# Patient Record
Sex: Female | Born: 1985 | Race: White | Hispanic: No | Marital: Single
Health system: Southern US, Community
[De-identification: ages and names within clinical notes are randomized; demographics above are authoritative.]

## PROBLEM LIST (undated history)

## (undated) DIAGNOSIS — F419 Anxiety disorder, unspecified: Secondary | ICD-10-CM

---

## 2009-08-26 ENCOUNTER — Emergency Department: Admit: 2009-08-26 | Disposition: A | Discharge status: Home / Self Care | Payer: Self-pay | Source: Ambulatory Visit

## 2009-08-26 LAB — CBC AND DIFFERENTIAL
Baso # K/uL: 0 THOU/uL (ref 0.0–0.1)
Basophil %: 0.4 % (ref 0.1–1.2)
Eos # K/uL: 0 THOU/uL (ref 0.0–0.4)
Eosinophil %: 0.1 % — ABNORMAL LOW (ref 0.7–5.8)
Hematocrit: 41 % (ref 34–45)
Hemoglobin: 14.2 g/dL (ref 11.2–15.7)
Lymph # K/uL: 1.6 THOU/uL (ref 1.2–3.7)
Lymphocyte %: 18.3 % — ABNORMAL LOW (ref 19.3–51.7)
MCV: 91 fL (ref 79–95)
Mono # K/uL: 0.6 THOU/uL (ref 0.2–0.9)
Monocyte %: 7.1 % (ref 4.7–12.5)
Neut # K/uL: 6.3 THOU/uL — ABNORMAL HIGH (ref 1.6–6.1)
Platelets: 224 THOU/uL (ref 160–370)
RBC: 4.5 MIL/uL (ref 3.9–5.2)
RDW: 11.9 % (ref 11.7–14.4)
Seg Neut %: 74.1 % — ABNORMAL HIGH (ref 34.0–71.1)
WBC: 8.5 THOU/uL (ref 4.0–10.0)

## 2009-08-26 LAB — URINALYSIS REFLEX TO CULTURE
Blood,UA: NEGATIVE
Ketones, UA: NEGATIVE
Nitrite,UA: NEGATIVE
Protein,UA: NEGATIVE mg/dL

## 2009-08-26 LAB — URINALYSIS WITH REFLEX TO CULTURE
Specific Gravity,UA: 1.006 (ref 1.002–1.030)
pH,UA: 7 (ref 5.0–8.0)

## 2009-08-26 LAB — RUQ PANEL (ED ONLY)
ALT: 13 U/L (ref 0–35)
AST: 21 U/L (ref 0–35)
Albumin: 4.5 g/dL (ref 3.5–5.2)
Alk Phos: 60 U/L (ref 35–105)
Amylase: 55 U/L (ref 28–100)
Bilirubin,Direct: 0.2 mg/dL (ref 0.0–0.3)
Bilirubin,Total: 0.8 mg/dL (ref 0.0–1.2)
Lipase: 26 U/L (ref 13–60)
Total Protein: 7 g/dL (ref 6.3–7.7)

## 2009-08-26 LAB — URINE MICROSCOPIC (IQ200)
RBC,UA: NONE SEEN /HPF (ref 0–2)
WBC,UA: 1 /HPF (ref 0–5)

## 2009-08-26 LAB — PLASMA PROF 7 (ED ONLY)
Anion Gap,PL: 10 (ref 7–16)
CO2,Plasma: 23 mmol/L (ref 20–28)
Chloride,Plasma: 107 mmol/L (ref 96–108)
Creatinine: 0.61 mg/dL (ref 0.51–0.95)
GFR,Black: >59 *
GFR,Caucasian: >59 *
Glucose,Plasma: 81 mg/dL (ref 74–106)
Potassium,Plasma: 3.5 mmol/L (ref 3.4–4.7)
Sodium,Plasma: 140 mmol/L (ref 132–146)
UN,Plasma: 9 mg/dL (ref 6–20)

## 2009-08-26 LAB — POCT URINE PREGNANCY (ED ONLY): Preg,UR POCT: NEGATIVE m[IU]/mL

## 2009-08-26 NOTE — ED Provider Notes (Signed)
 VISIT NUMBER: [id number]    TIME SEEN IN THE EMERGENCY ROOM:  08/26/2008 at 1 p.m.    TIME OF DISCHARGE:  3:30 p.m.    CHIEF COMPLAINT: Abdominal pain.    HPI:  This patient is a 24 year old female with what appears to be chronic  abdominal pain, who presents to the emergency room with complaints of  abdominal pain worse on the right side. The patient did have a couple  episodes of nausea and vomiting this morning. She reports tactile fevers;  no chills. She denies any urinary symptoms or vaginal discharge. The  patient had a normal bowel movement this morning. The patient has been  drinking appropriately, although states she has had slightly decreased  solid intake. She reports being able to tolerate yogurt and a granola bar  for breakfast. The patient states that she was recently admitted to  [facility] for gastroenteritis, at which time she did undergo an ultrasound  and a CT scan of her abdomen and pelvis that was unremarkable. The patient  states that her pain does appear to be similar to the pain a couple weeks  ago.    ALLERGIES:  Tramadol, which causes dizziness and nausea.    MEDICATIONS:  Depo-Provera.    PAST MEDICAL / SURGICAL HISTORY:  Gastroenteritis and abdominal pain.    FAMILY HISTORY:  None.    SOCIAL HISTORY:  The patient smokes three cigarettes per day; has  occasional alcohol use; denies any street drugs; works at [employer]; lives at home with her roommate, and denies history of domestic  violence.    REVIEW OF SYSTEMS:  Pertinent per the HPI and PMH.    PHYSICAL EXAMINATION:  Temperature: 36.4.  Respiratory rate: 20.  Pulse: 72.  Blood pressure: 118/77.  Pulse oximetry: 98% on room air.  General Appearance: The patient does not appear to be in any acute  distress.  Eyes: Pupils are equal and reactive to light. Extraocular movements are  intact.  Head, Ears, Nose, and Throat: Hearing and voice are within normal limits.  Oropharynx, lips, and gums are moist.  Cardiovascular: S1 and S2.  Regular rate and rhythm.  Pulmonary: The lungs are clear to auscultation bilaterally.  Abdomen: The abdomen is soft, with minimal tenderness to the right lower  quadrant as well as right upper quadrant. No rebound or guarding. No psoas  sign. No obturator sign. Normoactive bowel sounds. No CVA tenderness. Skin:  Color and temperature are within normal limits.  Musculoskeletal: The patient appears to be moving all extremities without  difficulty.  Psychiatric: Judgment, orientation, and mood are all within normal limits.      MEDICAL DECISION MAKING/CONDITION:  This patient is a 24 year old female  who presents to the emergency room with complaints of abdominal pain; two  episodes of nausea, vomiting, and tactile fevers. The patient was recently  admitted to [facility] for gastroenteritis. The patient states that  her pain does feel similar to this, although she states that she did have a  normal bowel movement this morning, and denies any history of diarrhea.        DDX:  Acute-on-chronic abdominal pain; viral illness; appendicitis;  cholecystitis; hepatitis.        PLAN:  To start with a screening evaluation including an ED7, CBC,        right upper quadrant panel, and urinalysis. Her pain will be treated        with intramuscular injection of morphine and Phenergan for nausea.  Should her blood work come back abnormal, we will decide whether or        not we will proceed with any further imaging. However, I am hesitant        to do this, given that the patient has a recent CT scan as of 2 weeks        ago and states that her pain is very similar to the pain she was        experiencing back then.    DATA:        LABS:  The patient has had basic laboratory work including an ED7,        CBC, right upper quadrant panel, and urinalysis, all unremarkable.        Urine pregnancy test was negative.        RADIOLOGIC STUDIES:        ECG:        RHYTHM STRIPS:    PROCEDURE (Attestation):  CRITICAL CARE TIME  (Exclusive of other billable procedures):  CONSULT:  PCP NOTIFIED:  SMOKING CESSATION COUNSELING:    FOLLOW-UP NOTE AND DISPOSITION:  Patient reevaluated and states that her  pain has improved. She has not had any nausea, vomiting, or diarrhea during  her stay here in the emergency room. Her work-up has been essentially  negative, and she is requesting to eat, which I believe is a good sign. The  patient will be discharged to home; advised to follow up with her primary  care physician, who may decide that the patient needs a referral to  possibly GI, given that these symptoms do appear to be quite chronic. She  is to return to the emergency room for fevers, chills, worsening pain,  uncontrolled nausea, vomiting, or further concerns.    CONDITION: Satisfactory.    ED DIAGNOSIS:  Nonspecific abdominal pain.            Dictated by:  Elson Areas, NP  Electronically Signed and Finalized by  Elson Areas, NP 08/30/2009 18:36  ___________________________________________  Elson Areas, NP  DD:   08/26/2009  DT:   08/26/2009  7:39 P  NF/AO1#[id number]  [id number]    cc:

## 2014-09-28 ENCOUNTER — Other Ambulatory Visit: Payer: Self-pay | Admitting: Emergency Medicine

## 2014-09-28 LAB — COMPREHENSIVE METABOLIC PANEL
ALT: 21 U/L^U/L (ref 12–78)
AST: 20 U/L^U/L (ref 11–37)
Albumin: 3.7 g/dL^g/dL (ref 3.4–5.0)
Alk Phos: 108 U/L^U/L (ref 45–117)
Anion Gap: 7 (ref 3–11)
Bilirubin,Total: 0.6 mg/dL^mg/dL (ref 0.2–1.0)
CO2: 24 meq/L^meq/L (ref 21–32)
Calcium: 8.7 mg/dL^mg/dL (ref 8.5–10.1)
Chloride: 109 meq/L^meq/L — ABNORMAL HIGH (ref 98–107)
Creatinine: 0.6 mg/dL^mg/dL (ref 0.6–1.3)
GFR,Black: 144 mL/min/{1.73_m2}
GFR,Caucasian: 119 mL/min/{1.73_m2}
Glucose: 92 mg/dL^mg/dL (ref 70–100)
Lab: 8 mg/dL^mg/dL (ref 7–18)
Potassium: 4.1 meq/L^meq/L (ref 3.5–5.1)
Sodium: 140 meq/L^meq/L (ref 136–145)
Total Protein: 6.9 g/dL^g/dL (ref 6.4–8.2)

## 2014-09-28 LAB — URINALYSIS REFLEX TO CULTURE
Bilirubin,Ur: NEGATIVE
Blood,UA: NEGATIVE
Glucose,UA: NEGATIVE mg/dL^mg/dL
Ketones, UA: NEGATIVE mg/dL^mg/dL
Nitrite,UA: NEGATIVE
Protein,UA: NEGATIVE mg/dL^mg/dL
RBC,UA: 2 /HPF^/HPF (ref 0–5)
Renal Epithel,UA: 10 /HPF^/HPF
Specific Gravity,UA: 1.017 (ref 1.005–1.030)
Urobilinogen,UA: 0.2 mg/dL^mg/dL
WBC,UA: 9 /HPF^/HPF — ABNORMAL HIGH (ref 0–5)
pH,UA: 7.5 (ref 5.0–8.0)

## 2014-09-28 LAB — CBC AND DIFFERENTIAL
Baso # K/uL: 0 10*3/uL (ref 0.0–0.1)
Basophil %: 0.8 %^% (ref 0.1–1.2)
Eos # K/uL: 0 10*3/uL (ref 0.0–0.4)
Eosinophil %: 0.8 %^% (ref 0.7–5.8)
Hematocrit: 43.9 %^% (ref 34.1–44.9)
Hemoglobin: 15.8 g/dL^g/dL — ABNORMAL HIGH (ref 11.2–15.7)
Immature Granulocytes Absolute: 0.01 10*3/uL (ref 0.0–0.2)
Immature Granulocytes: 0.2 %^% (ref 0.0–2.0)
Lymph # K/uL: 1.4 10*3/uL (ref 1.2–3.7)
Lymphocyte %: 28.6 %^% (ref 19.3–51.7)
MCH: 32.3 pg^pg — ABNORMAL HIGH (ref 25.6–32.2)
MCHC: 36 g/dL^g/dL — ABNORMAL HIGH (ref 32.2–35.5)
MCV: 89.8 fL^fL (ref 79.4–94.8)
Mono # K/uL: 0.4 10*3/uL (ref 0.2–0.9)
Monocyte %: 9.2 %^% (ref 4.7–12.5)
Neut # K/uL: 2.9 10*3/uL (ref 1.6–6.0)
Nucl RBC %: 0 /100 WBC^/100 WBC (ref 0.0–0.2)
Platelets: 216 10*3/uL (ref 182–369)
RBC Distribution Width-SD: 37.7 fL^fL (ref 36.4–46.3)
RBC: 4.89 10*6/uL (ref 3.93–5.22)
RDW: 11.5 %^% — ABNORMAL LOW (ref 11.7–14.4)
Seg Neut %: 60.4 %^% (ref 34.0–71.1)
WBC: 4.8 10*3/uL (ref 4.0–10.0)

## 2014-09-28 LAB — PREGNANCY, URINE: Preg Test,UR: NEGATIVE

## 2014-09-28 LAB — LIPASE: Lipase: 121 U/L^U/L (ref 73–393)

## 2014-12-10 ENCOUNTER — Other Ambulatory Visit: Payer: Self-pay | Admitting: Family Medicine

## 2014-12-10 LAB — COMPREHENSIVE METABOLIC PANEL
ALT: 29 U/L^U/L (ref 12–78)
AST: 23 U/L^U/L (ref 11–37)
Albumin: 3.7 g/dL^g/dL (ref 3.4–5.0)
Alk Phos: 123 U/L^U/L — ABNORMAL HIGH (ref 45–117)
Anion Gap: 8 meq/L^meq/L (ref 3–11)
Bilirubin,Total: 0.6 mg/dL^mg/dL (ref 0.2–1.0)
CO2: 26 meq/L^meq/L (ref 21–32)
Calcium: 8.9 mg/dL^mg/dL (ref 8.5–10.1)
Chloride: 106 meq/L^meq/L (ref 98–107)
Creatinine: 0.7 mg/dL^mg/dL (ref 0.6–1.3)
GFR,Black: 121 mL/min/{1.73_m2}
GFR,Caucasian: 100 mL/min/{1.73_m2}
Glucose: 114 mg/dL^mg/dL — ABNORMAL HIGH (ref 70–100)
Lab: 10 mg/dL^mg/dL (ref 7–18)
Potassium: 3.5 meq/L^meq/L (ref 3.5–5.1)
Sodium: 140 meq/L^meq/L (ref 136–145)
Total Protein: 7.6 g/dL^g/dL (ref 6.4–8.2)

## 2014-12-10 LAB — TSH: TSH: 1.71 uIU/mL^uIU/mL (ref 0.36–3.74)

## 2014-12-10 LAB — CBC AND DIFFERENTIAL
Baso # K/uL: 0.1 10*3/uL (ref 0.0–0.1)
Basophil %: 0.6 %^% (ref 0.1–1.2)
Eos # K/uL: 0 10*3/uL (ref 0.0–0.4)
Eosinophil %: 0.5 %^% — ABNORMAL LOW (ref 0.7–5.8)
Hematocrit: 46.3 %^% — ABNORMAL HIGH (ref 34.1–44.9)
Hemoglobin: 16.2 g/dL^g/dL — ABNORMAL HIGH (ref 11.2–15.7)
Immature Granulocytes Absolute: 0.03 10*3/uL (ref 0.0–0.2)
Immature Granulocytes: 0.3 %^% (ref 0.0–2.0)
Lymph # K/uL: 2.1 10*3/uL (ref 1.2–3.7)
Lymphocyte %: 24.2 %^% (ref 19.3–51.7)
MCH: 31.3 pg^pg (ref 25.6–32.2)
MCHC: 35 g/dL^g/dL (ref 32.2–35.5)
MCV: 89.6 fL^fL (ref 79.4–94.8)
Mono # K/uL: 0.6 10*3/uL (ref 0.2–0.9)
Monocyte %: 7 %^% (ref 4.7–12.5)
Neut # K/uL: 5.9 10*3/uL (ref 1.6–6.0)
Nucl RBC %: 0 /100 WBC^/100 WBC (ref 0.0–0.2)
Platelets: 250 10*3/uL (ref 182–369)
RBC Distribution Width-SD: 37.2 fL^fL (ref 36.4–46.3)
RBC: 5.17 10*6/uL (ref 3.93–5.22)
RDW: 11.5 %^% — ABNORMAL LOW (ref 11.7–14.4)
Seg Neut %: 67.4 %^% (ref 34.0–71.1)
WBC: 8.7 10*3/uL (ref 4.0–10.0)

## 2014-12-10 LAB — TIBC: TIBC: 327 ug/dL^ug/dL (ref 250–450)

## 2014-12-10 LAB — VITAMIN B12: Vitamin B12: 332 pg/mL^pg/mL (ref 193–986)

## 2014-12-10 LAB — HEMOGLOBIN A1C: Hemoglobin A1C: 5.2 %^% (ref 4.8–5.6)

## 2014-12-10 LAB — IRON: Iron: 73 ug/dL^ug/dL (ref 50–180)

## 2014-12-24 ENCOUNTER — Ambulatory Visit: Payer: Self-pay | Admitting: Family Medicine

## 2014-12-25 LAB — VITAMIN D
25-OH VIT D2: <1 ng/mL^ng/mL
25-OH VIT D3: 25.2 ng/mL^ng/mL
25-OH Vit Total: 25.2 ng/mL^ng/mL — ABNORMAL LOW (ref 30.0–80.0)

## 2015-12-03 ENCOUNTER — Other Ambulatory Visit: Payer: Self-pay | Admitting: Emergency Medicine

## 2015-12-03 LAB — COMPREHENSIVE METABOLIC PANEL
ALT: 9 U/L^U/L (ref 0–35)
AST: 16 U/L^U/L (ref 0–35)
Albumin: 4.1 g/dL^g/dL (ref 3.5–5.2)
Alk Phos: 89 U/L^U/L (ref 35–105)
Anion Gap: 13 (ref 7–16)
Bilirubin,Total: 0.8 mg/dL^mg/dL (ref 0.0–1.2)
CO2: 21 mmol/L^mmol/L (ref 20–28)
Calcium: 9.3 mg/dL^mg/dL (ref 8.8–10.2)
Chloride: 105 mmol/L^mmol/L (ref 96–108)
Creatinine: 0.7 mg/dL^mg/dL (ref 0.51–0.95)
GFR,Black: 120 mL/min/{1.73_m2}
GFR,Caucasian: 99 mL/min/{1.73_m2}
Glucose: 95 mg/dL^mg/dL (ref 60–99)
Lab: 12 mg/dL^mg/dL (ref 6–20)
Potassium: 3.8 mmol/L^mmol/L (ref 3.4–4.7)
Sodium: 139 mmol/L^mmol/L (ref 133–145)
Total Protein: 7.5 g/dL^g/dL (ref 6.3–7.7)

## 2015-12-03 LAB — URINALYSIS WITH REFLEX TO MICROSCOPIC
Bilirubin,Ur: NEGATIVE
Blood,UA: NEGATIVE
Epithelial Cells: 10 /HPF^/HPF
Glucose, Ur: NEGATIVE mg/dL^mg/dL
Ketones, UA: 80 mg/dL^mg/dL — AB
Leuk Esterase,UA: NEGATIVE
Nitrite,UA: NEGATIVE
Protein,UA: NEGATIVE mg/dL^mg/dL
RBC,UA: 5 /HPF^/HPF (ref 0–5)
Specific Gravity,UA: 1.025 (ref 1.005–1.030)
Urobilinogen,UA: 0.2 mg/dL^mg/dL
WBC,UA: 5 /HPF^/HPF (ref 0–5)
pH: 7.5 (ref 5.0–8.0)

## 2015-12-03 LAB — CBC AND DIFFERENTIAL
Baso # K/uL: 0 10*3/uL (ref 0.0–0.1)
Basophil %: 0.6 %^% (ref 0.1–1.2)
Eos # K/uL: 0 10*3/uL (ref 0.0–0.4)
Eosinophil %: 0.3 %^% — ABNORMAL LOW (ref 0.7–5.8)
Hematocrit: 43.8 %^% (ref 34.1–44.9)
Hemoglobin: 15.4 g/dL^g/dL (ref 11.2–15.7)
Immature Granulocytes Absolute: 0.02 10*3/uL (ref 0.0–0.2)
Immature Granulocytes: 0.3 %^% (ref 0.0–2.0)
Lymph # K/uL: 1.2 10*3/uL (ref 1.2–3.7)
Lymphocyte %: 17.7 %^% — ABNORMAL LOW (ref 19.3–51.7)
MCH: 31.4 pg^pg (ref 25.6–32.2)
MCHC: 35.2 g/dL^g/dL (ref 32.2–35.5)
MCV: 89.4 fL^fL (ref 79.4–94.8)
Mono # K/uL: 0.6 10*3/uL (ref 0.2–0.9)
Monocyte %: 8.6 %^% (ref 4.7–12.5)
Neut # K/uL: 5 10*3/uL (ref 1.6–6.0)
Nucl RBC # K/uL: 0 /100 WBC^/100 WBC (ref 0.0–0.2)
Platelets: 263 10*3/uL (ref 182–369)
RBC Distribution Width-SD: 36.5 fL^fL (ref 36.4–46.3)
RBC: 4.9 10*6/uL (ref 3.93–5.22)
RDW: 11.3 %^% — ABNORMAL LOW (ref 11.7–14.4)
Seg Neut %: 72.5 %^% — ABNORMAL HIGH (ref 34.0–71.1)
WBC: 6.9 10*3/uL (ref 4.0–10.0)

## 2015-12-03 LAB — PREGNANCY TEST, SERUM: Preg,Serum: NEGATIVE

## 2015-12-03 LAB — LIPASE: Lipase: 24 U/L^U/L (ref 13–60)

## 2015-12-03 LAB — HEMATOCRIT: Hematocrit: 42.2 %^% (ref 34.1–44.9)

## 2015-12-03 LAB — OCCULT BLOOD X 1, STOOL: Occult Blood 1: NEGATIVE

## 2015-12-03 LAB — HEMOGLOBIN: Hemoglobin: 15 g/dL^g/dL (ref 11.2–15.7)

## 2016-01-14 ENCOUNTER — Emergency Department
Admission: EM | Admit: 2016-01-14 | Disposition: A | Discharge status: Home / Self Care | Payer: Self-pay | Source: Ambulatory Visit | Attending: Psychiatry | Admitting: Psychiatry

## 2016-01-14 NOTE — CPEP Notes (Addendum)
CPEP Provider Evaluation Note    Patient seen and evaluated by me today, 01/14/2016 at 5:20am.    Demographics   Name: Tammy Fleming  DOB: [id number]  Address: [address]  [place] [address]  Home Phone:[phone]  Emergency Contact: Extended Emergency Contact Information  Primary Emergency Contact: [facility],Holly  Home Phone: [phone]  Work Phone: [phone]  Relation: Parent  Secondary Emergency Contact: Lingard,Daniel  Home Phone: [phone]  Work Phone: [phone]  Relation: Other/Unknown    History   Chief Complaint/HPI  Patient is a 30 y.o. 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    HPI    For additional details on current presentation, current treatment providers and efforts to contact them, please see Clinical Evaluator and Collateral notes, which I reviewed and confirmed.    Psychiatric History  Current Treatment Providers  Psychiatrist: No  Therapist: No  Case manager: No  Other treatment providers: None  Psychiatric History  Previous Diagnoses: None  History of suicide attempts: None  History of violence: None  Psychiatric hospitalizations: None  CPEP/Psych ED visits: None  History of abuse or trauma: None  Legal history: None  Is the patient currently in the [place] military or has been on active duty in the past?: No  Family psychiatric history: None    Substance Use History       Past Medical History   History reviewed. No pertinent past medical history.    History reviewed. No pertinent  surgical history.    No family history on file.    Social History   Demographics  Religious/Cultural Factors: None  [place]  Marital status: Single  Ethnicity/Race: Caucasian  Is patient a primary care giver?: No  Primary Language: English  Primary Care Taker of?: No one  Living Status: With family  Education Information  Attends School: No  Income Information  Vocational: Full time employment  Income Situation: Psychologist, counsellingalary/wages  Insurance Information: Blue Choice  Prescription Coverage: and has  Served in [place] military: No                 Strengths   Strengths (pick two): Lives with partner or other family, Supportive relationships    Living Situation     Questions Responses    Patient lives with Family    Homeless No    Caregiver for other family member No    External Services None    Employment Employed    Domestic Violence Risk No          Review of Systems   Review of Systems    MSE   Mental Status Exam  Appearance: Appropriately dressed  Relationship to Interviewer: Cooperative, Eye contact good, Engages well  Psychomotor Activity: Normal  Abnormal Movements: None  Station/Gait : Normal  Speech : Regular rate  Language: Normal comprehension  Mood: Euthymic  Affect: Appropriate  Thought Process: Logical  Thought Content: No suicidal ideation, No homicidal ideation  Perceptions/Associations : No hallucinations  Sensorium:  Alert  Cognition: Good attention span  Fund of Knowledge: Normal  Insight : Fair  Judgement: Fair      Labs   All labs in the last 24 hours: No results found for this or any previous visit (from the past 24 hour(s)).    Initial Assessment   Initial Clinical Impression and Differential Diagnosis  SI in the context of relationship issues and resolved.DDx adjustment disorder,anxiety.  Diagnosis     Final diagnoses:   Adjustment disorder with mixed anxiety and depressed mood       CPEP Plan   MD/NP:  MD/NP to do: no action items at this time    RN:  RN to do: no action items at  this time    Clinical evaluator:  Clinical Evaluator to do : safety plan, DIRA, outpatient chemical dependency appointment/info     Lethality: Additional Risk Assessment (DIRA) is indicated    Addictive Behavior: In my clinical opinion, NO further assessment is necessary other than what is in my note.    Collateral information from current providers :family    Psychosocial assessment  Complete purple data sheet    Luis Sami Carolin SicksM Cailan General, MD     Clint BolderHubeishy, Jarvis Sawa M, MD  01/14/16 [phone]  Completed Assessment and Disposition Addendum      The following additional data obtained during CPEP interventions were reviewed and discussed with the interdisciplinary team:     Collateral information, as documented in the Evaluator note.     Updated Addiction Assessment:               Data to Inform Risk Assessment (DIRA)    Unique Strengths  Unique strengths  Who are the most important people in your life?: Dan Camey-Brother  What are three positive words that you or someone else might use to describe you?: Nice, Funny, Caring  Who in your life can you tell anything to?: Nobody  What special skills or strengths do you have?: Good Cook  Protective Factors  Protective factors  Able to identify reasons for living: Yes  Good physical health: Yes  Actively engaged in treatment: No  Lives with partner or other family: Yes  Children in the home: No  Religious/ spiritual belief system: No  Future oriented: Yes  Supportive relationships: Yes   Predisposing Vulnerabilities  Predisposing Vulnerabilities  Predisposing vulnerabilities: recurrent mental health condition  Impulsivity and Violence  Impulsivity and Violence  Impulsivity/self control (includes substance abuse): poor distress tolerance  Current homicidal threats or ideation: No  Access to Weapons  Access to Firearms  Access to firearms: none  History of Suicidal Behavior  Past suicidal behavior  Past suicidal behavior: No  [place] Suicide Severity Rating Scale  [place] Suicide  Severity Rating Scale-Screen  Wish to be dead: No  Suicidal thoughts: No  Suicide behavior question-preparation: No  Safety Concerns Communication     Stressors  Stressors  Stressors: relationships  Do stressors involve recent loss of self-respect/dignity: No  Presentation  Clinical Presentation  Clinical presentation (recent changes): increased depression symptoms  Engagement  Engagement and Reliability During Current Visit  Patient report appears to be credible/consistent: Yes  Patient is actively engaged with team in assessment and planning: Yes    Risk Formulation:   Risk Status (relative to others in a stated population): low   Risk State   (relative to self at baseline or selected time period): low   Available Resources (internal and social strengths to support safety and treatment planning): family  Foreseeable Changes (changes that could quickly increase risk state): depression    Disposition Decision Formulation   In my clinical opinion, based on the above documented information, assessments, and multidisciplinary consultation, at this time a psychiatric hospitalization or extended observation of Czarina Gingras is not indicated    Disposition Plan and Recommendations      CPEP Plan: Discharge the patient after interventions and referrals indicated have been completed     Family, current providers and referral source were informed of disposition, as indicated in the Clinical Evaluator's note.    Did this patient's condition require a mandatory 9.46 report to the [facility]? no       Clint Bolder, MD     Clint Bolder, MD  01/14/16 [phone]

## 2016-01-14 NOTE — CPEP Notes (Signed)
History of Present Illness:   Patient seen by Tammy Johnsheresa Mitchel Delduca, RN on 01/14/2016 at 0530     Tammy Fleming is a 30 yo Caucasian female with a previous self reported mental health history of depression, with no formal diagnosis, who presents to CPEP under mental hygiene arrest with report of voicing sucidal statements after an argument with "a guy I was seeing."  Events leading to CPEP presentation include an argument with a man she was dating and expressing SI to a friend of her's as well as posting her desire to no longer live in Facebook.  Relevant or contributing stressors include lack of interpersonal relationships and trusting friendships .     Presentation: Patient is very cooperative with the evaluation process and  engages with Clinical research associatewriter. Patient initially presented with suicidal thoughts/threats. During interview patient was pleasant and willing to engage. Pt was willing to disclose with this Clinical research associatewriter.      Current Psychiatric Treatment:   Pt has no outpatient provider.    Collateral:     Personal   Name: Tammy Fleming   Relationship:Brother   Number: [phone]   Contact Method: Phone   Input: Tammy Fleming reported that he has never know the pt to express SI in the past.  He reports that pt has had "falling out's" with some of her friends over the last year and since then has had increased depressive symptoms.  He reports that he became concerned when he found that pt had left without any of her things and expressed SI on Facebook.  He reports their mother had called him upset by what was written and all things considered, he contacted the [facility] to [facility] her.  He feels ok for pt to be discharged home, as they live together.      Clinical Interventions Completed:    Psychosocial Assessment   Purple Demographic Sheet    Psych History   DIRA   Collateral   Safety Planning

## 2016-01-14 NOTE — CPEP Notes (Signed)
CPEP Charge Nurse Note    Report [facility] from [facility], via [facility], [facility]  accompanied by patient alone.  Pt verbalized SI to a friend tonight.  Friend called police.    Chief Complaint:  Chief Complaint   Patient presents with    Psychiatric Evaluation       Allergies:  Allergies as of 01/14/2016    (No Known Allergies (drug, envir, food or latex))       No past medical history on file.    Substance use: denies    Ingestion: Denies    Medical clearance: NA    Vital signs:  Visit Vitals    BP 115/72    Pulse 82    Temp 36.7 C (98.1 F)    Resp 16    Ht 1.549 m (5\' 1" )    Wt 66.7 kg (147 lb)    SpO2 96%    BMI 27.78 kg/m2     Last Nursing documented pain:  0-10 Scale: 0 (01/14/16 0117)      Pre-Arrival Notifications: No    Patient location: EMS Elevator    Doristine LocksMichael P Mitchell Iwanicki, RN, 1:22 AM

## 2016-01-14 NOTE — ED Notes (Signed)
01/14/16 0641   CPEP Summary of Services   Safety precautions implemented security metal scanning completed;personal belongings secured   Case consultation/discussion involving attending;registered nurse   Crisis intervention and safety planning involving verbal intervention;access to firearms remediated;safety plan #1 developed (comment)  (Brother reports having firearms-is removing them from the home. )   Collateral information obtained from family member(s)   Referral offered, accepted and completed for  Outpatient Mental Health services

## 2016-01-14 NOTE — CPEP Notes (Signed)
CPEP Triage Note    Arrival     Patient is oriented to unit and CPEP evaluation process: Yes  Patient is accompanied by: patient alone  Patient with MHA: Yes    History and Chief Complaint    Reason for current presentation: "Just had a mental breakdown, I guess."  Pt reports feeling depressed for the past three years after the death of her grandmother.  Suicidal: no  Homicidal: no    Social History   reports that she has never smoked. She has never used smokeless tobacco. She reports that she does not drink alcohol or use illicit drugs.    Medication Reconciliation    Medication data collection:  Meds reviewed with patient during triage yes    Physical Assessment    Pain assessment: Last Nursing documented pain:  0-10 Scale: 0 (01/14/16 0202)    Visit Vitals    BP 115/72    Pulse 82    Temp 36.7 C (98.1 F)    Resp 16    Ht 1.549 m (5\' 1" )    Wt 66.7 kg (147 lb)    SpO2 96%    BMI 27.78 kg/m2       Medical/Surgical History    PMH: History reviewed. No pertinent past medical history.    PSH: History reviewed. No pertinent surgical history.    Doristine LocksMichael P Amberli Ruegg, RN, 2:02 AM

## 2016-01-14 NOTE — ED Triage Notes (Signed)
Pt mha'd, having a bad day and made SI comments to her friend. Denies etoh or drugs       Triage Note   Fonnie Jarvisebecca A Decoda Van, RN

## 2016-01-14 NOTE — Discharge Instructions (Signed)
CPEP Discharge Instructions    Discharge Date: 01/14/2016    Discharge Time:   0715    Follow-ups:  *Follow up with Mental Health Counseling with:  [facility]  [address]  [place], [place] [id number]  [phone]        When to call for help:    Call your psychiatric outpatient provider if experiencing any of these symptoms: increased irritability, sleep changes, appetite changes, energy changes, thoughts to harm yourself or others, anxiety, fear, auditory or visual hallucinations.  [facility] Helpline (24 hours/7 days) [phone] Consulting civil engineer(TTY)  Mobile Crisis team: [phone]    General Instructions:  Other written information given to the patient: Yes Work Excuse  Return to [employer]Work/School on: 01/15/2016          The above information has been discussed with me and I have received a copy.  I understand that I am advised to follow the instructions given to me to appropriately care for my condition.

## 2016-01-14 NOTE — ED Notes (Signed)
01/14/16 [id number]   Disposition details   Patient is being discharged to community   Family notification done via phone   Name of family member notified Jesusita Okaan Flurry-Brother   Outpatient provider notification of disposition patient has no outpatient provider   Living situation: notification of disposition via phone   Transportation arranged via family   Transporting family member name Mother-Holly Tresa EndoKelly   Patient belongings  given to patient   Medications NA- patient has none   Valuables given to patient

## 2016-01-14 NOTE — ED Notes (Signed)
01/14/16 0223   Admission   CPEP Legal Status on Arrival [facility]   *Mode of Arrival Ambulatory   First Contact 0223   Admitting Procedure   *Belonging Search Yes   *Belongings Searched By Malissa Slay   *Patient Checked for Contraband Belongings Checked;Body Scanned with Wand;Clothing Checked   *Oriented to Unit Yes   15 Minute Checks Maintained Yes   Belongings   Dentures None   Vision - Corrective Lenses None   Hearing Aid/Cochlear Implant None   Jewelry None   Clothing With Patient  (sandles)   Other Valuables Cell phone;Credit Cards;Electronics;Money (Comment);Purse;Wallet  (headphones, $60)   Prosthesis / Assistive Devices None   Valuables Given To None   Cashier's Office No

## 2016-04-25 ENCOUNTER — Other Ambulatory Visit: Payer: Self-pay | Admitting: Emergency Medicine

## 2016-04-25 LAB — CBC AND DIFFERENTIAL
Baso # K/uL: 0 10*3/uL (ref 0.0–0.1)
Basophil %: 0.5 % (ref 0.1–1.2)
Eos # K/uL: 0.1 10*3/uL (ref 0.0–0.4)
Eosinophil %: 0.6 % — ABNORMAL LOW (ref 0.7–5.8)
Hematocrit: 41.4 % (ref 34.1–44.9)
Hemoglobin: 14.6 g/dL (ref 11.2–15.7)
Immature Granulocytes Absolute: 0.02 10*3/uL (ref 0.0–0.2)
Immature Granulocytes: 0.2 % (ref 0.0–2.0)
Lymph # K/uL: 1.5 10*3/uL (ref 1.2–3.7)
Lymphocyte %: 18.2 % — ABNORMAL LOW (ref 19.3–51.7)
MCH: 31.3 pg (ref 25.6–32.2)
MCHC: 35.3 g/dL (ref 32.2–35.5)
MCV: 88.7 fL (ref 79.4–94.8)
Mono # K/uL: 0.7 10*3/uL (ref 0.2–0.9)
Monocyte %: 8.5 % (ref 4.7–12.5)
Neut # K/uL: 5.8 10*3/uL (ref 1.6–6.0)
Nucl RBC # K/uL: 0 /100 WBC (ref 0.0–0.2)
Platelets: 188 10*3/uL (ref 182–369)
RBC Distribution Width-SD: 36.5 fL (ref 36.4–46.3)
RBC: 4.67 10*6/uL (ref 3.93–5.22)
RDW: 11.3 % — ABNORMAL LOW (ref 11.7–14.4)
Seg Neut %: 72 % — ABNORMAL HIGH (ref 34.0–71.1)
WBC: 8.1 10*3/uL (ref 4.0–10.0)

## 2016-04-25 LAB — D-DIMER, QUANTITATIVE: D-Dimer: 723 ng/mL D-DU (ref 0–250)

## 2016-04-25 LAB — BASIC METABOLIC PANEL
Anion Gap: 13 ^^L (ref 7–16)
CO2: 24 mmol/L (ref 20–28)
Calcium: 9.1 mg/dL (ref 8.8–10.2)
Chloride: 102 mmol/L (ref 96–108)
Creatinine: 0.66 mg/dL (ref 0.51–0.95)
GFR,Black: 127 mL/min/{1.73_m2}
GFR,Caucasian: 105 mL/min/{1.73_m2}
Glucose: 87 mg/dL (ref 60–99)
Lab: 9 mg/dL (ref 6–20)
Potassium: 4 mmol/L (ref 3.4–4.7)
Sodium: 139 mmol/L (ref 133–145)

## 2016-04-25 LAB — PREGNANCY TEST, SERUM: Preg,Serum: NEGATIVE ^^L

## 2016-04-25 LAB — TROPONIN I: Troponin T: <0.01 ng/mL (ref 0.00–0.02)

## 2016-04-25 LAB — LIPASE: Lipase: 28 U/L (ref 13–60)

## 2016-05-06 ENCOUNTER — Other Ambulatory Visit: Payer: Self-pay | Admitting: Gastroenterology

## 2016-05-06 LAB — COMPREHENSIVE METABOLIC PANEL
ALT: 89 U/L — ABNORMAL HIGH (ref 0–35)
Albumin: 3.4 g/dL — ABNORMAL LOW (ref 3.5–5.2)
Alk Phos: 65 U/L (ref 35–105)
Anion Gap: 10 ^^L (ref 7–16)
Bilirubin,Total: 0.4 mg/dL (ref 0.0–1.2)
CO2: 22 mmol/L (ref 20–28)
Calcium: 9.2 mg/dL (ref 8.8–10.2)
Chloride: 104 mmol/L (ref 96–108)
Creatinine: 0.64 mg/dL (ref 0.51–0.95)
GFR,Black: 132 mL/min/{1.73_m2}
GFR,Caucasian: 109 mL/min/{1.73_m2}
Glucose: 102 mg/dL — ABNORMAL HIGH (ref 60–99)
Lab: 8 mg/dL (ref 6–20)
Sodium: 136 mmol/L (ref 133–145)
Total Protein: 6.7 g/dL (ref 6.3–7.7)

## 2016-05-06 LAB — URINALYSIS WITH REFLEX TO MICROSCOPIC
Bilirubin,Ur: NEGATIVE ^^L
Blood,UA: NEGATIVE ^^L
Glucose, Ur: NEGATIVE mg/dL
Ketones, UA: NEGATIVE mg/dL
Leuk Esterase,UA: NEGATIVE ^^L
Nitrite,UA: NEGATIVE ^^L
Protein,UA: NEGATIVE mg/dL
Specific Gravity,UA: 1.024 ^^L (ref 1.005–1.030)
Urobilinogen,UA: 1 mg/dL
pH: 7 ^^L (ref 5.0–8.0)

## 2016-05-06 LAB — LIPASE: Lipase: 40 U/L (ref 13–60)

## 2016-05-06 LAB — PREGNANCY, URINE: Preg Test,UR: NEGATIVE ^^L

## 2016-05-06 LAB — CBC AND DIFFERENTIAL
Hematocrit: 37.7 % (ref 34.1–44.9)
Hemoglobin: 13.6 g/dL (ref 11.2–15.7)
MCH: 31.4 pg (ref 25.6–32.2)
MCHC: 36.1 g/dL — ABNORMAL HIGH (ref 32.2–35.5)
MCV: 87.1 fL (ref 79.4–94.8)
Nucl RBC # K/uL: 0 /100 WBC (ref 0.0–0.2)
Platelets: 150 10*3/uL — ABNORMAL LOW (ref 182–369)
RBC Distribution Width-SD: 36.6 fL (ref 36.4–46.3)
RBC: 4.33 10*6/uL (ref 3.93–5.22)
RDW: 11.4 % — ABNORMAL LOW (ref 11.7–14.4)
WBC: 7.4 10*3/uL (ref 4.0–10.0)

## 2016-05-06 LAB — DIFF MANUAL
Atypical Lymphocytes Manual: 17 %
Bands %: 1 % (ref 0–10)
Lymphocyte %: 26 % (ref 10–50)
Monocyte %: 5 % (ref 0–9)
RBC Morphology: NORMAL ^^L
Seg Neut %: 51 % (ref 37–80)

## 2016-05-06 LAB — AST: AST: 62 U/L — ABNORMAL HIGH (ref 0–35)

## 2016-05-06 LAB — POTASSIUM: Potassium: 4.8 mmol/L — ABNORMAL HIGH (ref 3.4–4.7)

## 2016-07-12 ENCOUNTER — Ambulatory Visit
Admission: AD | Admit: 2016-07-12 | Discharge: 2016-07-12 | Disposition: A | Discharge status: Home / Self Care | Payer: Self-pay

## 2016-07-12 ENCOUNTER — Encounter: Payer: Self-pay | Admitting: Family

## 2016-07-12 DIAGNOSIS — J039 Acute tonsillitis, unspecified: Secondary | ICD-10-CM

## 2016-07-12 LAB — POCT AMBULATORY RAPID STREP
Lot #: 171353
Rapid Strep Group A Throat-POC: NEGATIVE

## 2016-07-12 MED ORDER — CEFDINIR 300 MG PO CAPS *I*
300.0000 mg | ORAL_CAPSULE | Freq: Two times a day (BID) | ORAL | 0 refills | Status: DC
Start: 2016-07-12 — End: 2016-07-15

## 2016-07-12 NOTE — Discharge Instructions (Signed)
Salt water gargles, ice chips, popsicles can ease discomfort    Ibuprofen 400 mg every 6 hours can also help with discomfort and fevers    You are considered contagious until on the antibiotic for at least 24 hours and you are ALSO fever free for 24 hours without the aid of ibuprofen or tylenol.    Replace your toothbrush and toothpaste after being on the antibiotic for 24 hours to prevent reinfection    Take full course of antibiotic.    If you develop any shortness of breath, inability to handle your own secretions (drooling), difficulty breathing- especially when laying down you need to be evaluated in the ER urgently.       The following primary care physicians are  currently accepting new patients:    [facility]  [address], [address]  [phone]  Drue Secondonald Guzman, MD, (IM)    [facility]  [address],  [address]  [phone]  Rica MastShaula Woz, MD (FM)    [facility]  [address], [address]  [phone]  Adelene AmasMala Ashok, MD (FM)           [place]  [facility]  [address]  [phone]    [facility]  [address], [address]  [phone]  Dondra Spryebecca Lavender,[facility] MD (FM)  Marin OlpKarolina Lis-Hyjek, MD (FM)  Lavonne ChickStephen Lurie, MD, PhD (FM)  Jeanmarie Plantobbyn Upham, MD [facility](FM)     Accepting Pediatric Patients Only     [facility]  [facility]  [address]  [phone]  Yolanda MangesAlex Fahoury, MD (IM)  Cathie Oldenhristina William, MD (IM)    [facility]  [address] [place], [address]  [phone]  Isaac LaudAmy Yosha, MD (FM)   [facility]  [facility] [place]   [address]   [phone]   Georgia LopesPrity Rawal, MD (FM)   [facility] [place]   [address]   [phone]   Josefine ClassScott Wilson, MD (FM)     [facility]  [facility]  [phone] [address], [address]  [phone]  Eustace QuailAdrian Ashdown, MD (FM)     FM: Family Medicine   IM: Internal Medicine  IM/Peds: Internal Medicine & Pediatrics  MP: Family Medicine & Pediatrics  OB Obstetrics  Peds: Pediatrics   For physician referral services, please call [phone] [email or web address]     Updated 04/18/2016

## 2016-07-12 NOTE — UC Provider Note (Signed)
History     Chief Complaint   Patient presents with    Sore Throat     Pt comes in with sore throat x2 day and white stuff on tonsils. Pt also c/o facial pressure and pain in right ear that started this am. Pt took emergen-c and advil this am w/o relief. Chills today.      HPI Comments: 30 year old female with no reported significant past medical history presents complaining of sore throat for the past 2 days.  Patient states noted white tonsils on the right side along with right ear pain that started this morning.  Patient has been taking emergen-C and Advil with no relief in symptoms.  Patient started having chills and body aches today.  Patient denies any shortness of breath, cough, dyspnea with exertion, rash, recent travel, difficulty swallowing.      History provided by:  Patient  Language interpreter used: No        History reviewed. No pertinent past medical history.         History reviewed. No pertinent surgical history.    History reviewed. No pertinent family history.      Social History    reports that she has never smoked. She has never used smokeless tobacco. She reports that she does not drink alcohol or use illicit drugs. Her sexual activity history is not on file.    Living Situation     Questions Responses    Patient lives with Family    Homeless No    Caregiver for other family member No    External Services None    Employment Employed    Domestic Violence Risk No          Review of Systems   Review of Systems   Constitutional: Negative for chills, fatigue and fever.   HENT: Positive for congestion, ear pain (right ear), postnasal drip, sinus pressure and sore throat. Negative for hearing loss, rhinorrhea, sinus pain, trouble swallowing and voice change.    Eyes: Negative for discharge and redness.   Respiratory: Negative for cough, chest tightness and shortness of breath.    Cardiovascular: Negative for chest pain.   Gastrointestinal: Negative for abdominal pain, constipation, diarrhea,  nausea and vomiting.   Musculoskeletal: Negative for arthralgias and myalgias.   Skin: Negative for rash.   Neurological: Negative for dizziness, light-headedness and headaches.       Physical Exam     ED Triage Vitals   BP Heart Rate Heart Rate (via Pulse Ox) Resp Temp Temp src SpO2 (Retired) O2 Device O2 Flow Rate   07/12/16 1725 07/12/16 1725 -- 07/12/16 1725 07/12/16 1725 07/12/16 1725 07/12/16 1725 -- --   131/77 109  20 37.4 C (99.3 F) TEMPORAL 100 %        Weight           --                               Physical Exam   Constitutional: She is oriented to person, place, and time. She appears well-developed and well-nourished. No distress.   HENT:   Head: Normocephalic and atraumatic.   Right Ear: Hearing, tympanic membrane, external ear and ear canal normal. No mastoid tenderness. Tympanic membrane is not injected, not perforated and not bulging. No middle ear effusion.   Left Ear: Hearing, tympanic membrane, external ear and ear canal normal. No mastoid tenderness. Tympanic membrane is not  injected, not perforated and not bulging.  No middle ear effusion.   Nose: Mucosal edema and rhinorrhea present. No nasal deformity or septal deviation. Right sinus exhibits no maxillary sinus tenderness and no frontal sinus tenderness. Left sinus exhibits no maxillary sinus tenderness and no frontal sinus tenderness.   Mouth/Throat: Uvula is midline and mucous membranes are normal. No uvula swelling. Posterior oropharyngeal erythema present. No oropharyngeal exudate, posterior oropharyngeal edema or tonsillar abscesses. Tonsils are 2+ on the right. Tonsils are 0 on the left. Tonsillar exudate (right side).       +PND   Eyes: Conjunctivae and lids are normal. Pupils are equal, round, and reactive to light. Right eye exhibits no discharge. Left eye exhibits no discharge. No scleral icterus.   Neck: Trachea normal, normal range of motion and phonation normal. Neck supple.   Cardiovascular: Normal heart sounds.     Pulmonary/Chest: Effort normal and breath sounds normal. No respiratory distress. She has no decreased breath sounds. She has no wheezes. She has no rhonchi. She has no rales.   Lymphadenopathy:        Head (right side): No submental, no submandibular, no tonsillar, no preauricular, no posterior auricular and no occipital adenopathy present.        Head (left side): No submental, no submandibular, no tonsillar, no preauricular, no posterior auricular and no occipital adenopathy present.     She has no cervical adenopathy.   Neurological: She is alert and oriented to person, place, and time.   Skin: Skin is warm and dry. No rash noted.   Psychiatric: She has a normal mood and affect. Her behavior is normal. Judgment and thought content normal.   Nursing note and vitals reviewed.       Medical Decision Making      Amount and/or Complexity of Data Reviewed  Clinical lab tests: ordered and reviewed  Tests in the medicine section of CPT: ordered and reviewed        Initial Evaluation:  ED First Provider Contact     Date/Time Event User Comments    07/12/16 1736 ED First Provider Contact Etai Copado Initial Face to Face Provider Contact          Patient was seen on: 07/12/2016        Assessment:  30 y.o.female comes to the Urgent Care Center with sore throat, nasal congestion, right ear pain X 2 days.    Differential Diagnosis includes viral pharyngitis, strep pharyngitis, viral URI, allergic rhinitis, post nasal drip, mononucleosis, uvulitis, tonsilitis, PTA                  Plan:   Orders Placed This Encounter    Strep A culture, throat    POCT rapid strep    cefdinir (OMNICEF) 300 MG capsule       Recent Results (from the past 24 hour(s))   POCT rapid strep    Collection Time: 07/12/16  5:33 PM   Result Value Ref Range    Rapid Strep Group A Throat-POC Negative for Streptococcus Group A Antigen Negative    INTERNAL CONTROL RAPID STREP POCT *Yes-internal procedural control(s) acceptable     Exp date 08/16/2017      Lot # [id number]          Final Diagnosis    ICD-10-CM ICD-9-CM   1. Tonsillitis J03.90 463       Encourage fluids, encourage rest, good hand hygiene.    Use over the counter medications as discussed.  Please start the new medications as below:    Current Discharge Medication List      New Medications    Details Last Dose Given Next Dose Due Script Given?   cefdinir (OMNICEF) 300 mg Dose: 300 mg  Take 300 mg by mouth 2 times daily  Quantity 20 capsule, Refill 0  Start date: 07/12/2016, End date: 07/22/2016                   Please follow up with your physician as below:    Follow-up Information     Follow up with [facility] In 1 week.    Specialty:  Emergency Medicine    Why:  If symptoms worsen    Contact information:    [address] [place] [address]  [phone]        Thank you Bianna Haran for coming to [facility] for your health care concerns.    If your condition changes and/or worsens please follow up with her primary doctor and/or return to the urgent care center.    If short of breath, chest pains or any other concerns please report to the emergency room.    In the event of an Emergency dial 911.  Discharge Instructions     Salt water gargles, ice chips, popsicles can ease discomfort    Ibuprofen 400 mg every 6 hours can also help with discomfort and fevers    You are considered contagious until on the antibiotic for at least 24 hours and you are ALSO fever free for 24 hours without the aid of ibuprofen or tylenol.    Replace your toothbrush and toothpaste after being on the antibiotic for 24 hours to prevent reinfection    Take full course of antibiotic.    If you develop any shortness of breath, inability to handle your own secretions (drooling), difficulty breathing- especially when laying down you need to be evaluated in the ER urgently.       The following primary care physicians are  currently accepting new patients:    [facility]  [address], [address]  [phone]  Drue Second, MD, (IM)    [facility] [address],  [address]  [phone]  Rica Mast, MD (FM)    [facility]  [address]  [phone]  Adelene Amas, MD (FM)         [place]  [facility]  [address] [place]  [phone]    [facility]  [address]  [phone]  Dondra Spry, MD (FM)  Marin Olp, MD (FM)  Lavonne Chick, MD, PhD (FM)  Jeanmarie Plant, MD [facility])     Accepting Pediatric Patients Only     [facility]  [address]  [phone]  Yolanda Manges, MD (IM)  Cathie Olden, MD (IM)    [facility] Medicine  [address] [place], [address]  [phone]  Isaac Laud, MD (FM) [facility] [place]   [address]   [phone]   Georgia Lopes, MD (FM)   [facility] [place]   [address]   [phone]   Josefine Class, MD (FM)     Deputy  [facility]  [phone] [address]  [phone]  Eustace QuailAdrian Ashdown, MD (FM)     FM: Family Medicine   IM: Internal Medicine  IM/Peds: Internal Medicine & Pediatrics  MP: Family Medicine & Pediatrics  OB Obstetrics  Peds: Pediatrics                     For physician referral services, please call [phone] visit [email or web address]     Updated 04/18/2016         Final Diagnosis  Final diagnoses:   [J03.90] Tonsillitis (Primary)           Berdine AddisonLaura M Sayde Lish, NP    Collaborating physician Dorna BloomSuzanne Brendze, MD was immediately available     Berdine AddisonPaytash, Aleksa Catterton M, NP  07/12/16 [phone]

## 2016-07-12 NOTE — ED Triage Notes (Signed)
Pt comes in with sore throat x2 day and white stuff on tonsils. Pt also c/o facial pressure and pain in right ear that started this am. Pt took emergen-c and advil this am w/o relief. Chills today.        Triage Note   Coralie CarpenAriel Brown, RN

## 2016-07-14 ENCOUNTER — Observation Stay
Admission: EM | Admit: 2016-07-14 | Disposition: A | Discharge status: Home / Self Care | Payer: Self-pay | Source: Ambulatory Visit | Attending: Emergency Medicine | Admitting: Emergency Medicine

## 2016-07-14 ENCOUNTER — Encounter: Payer: Self-pay | Admitting: Student in an Organized Health Care Education/Training Program

## 2016-07-14 ENCOUNTER — Ambulatory Visit
Admission: AD | Admit: 2016-07-14 | Discharge: 2016-07-14 | Disposition: A | Discharge status: Other Acute Inpatient Hospital | Payer: Self-pay

## 2016-07-14 ENCOUNTER — Encounter: Payer: Self-pay | Admitting: Nurse Practitioner

## 2016-07-14 DIAGNOSIS — J36 Peritonsillar abscess: Secondary | ICD-10-CM

## 2016-07-14 LAB — CBC AND DIFFERENTIAL
Baso # K/uL: 0.1 10*3/uL (ref 0.0–0.1)
Basophil %: 0.2 %
Eos # K/uL: 0 10*3/uL (ref 0.0–0.4)
Eosinophil %: 0.1 %
Hematocrit: 43 % (ref 34–45)
Hemoglobin: 15 g/dL (ref 11.2–15.7)
IMM Granulocytes #: 0.1 10*3/uL (ref 0.0–0.1)
IMM Granulocytes: 0.5 %
Lymph # K/uL: 2 10*3/uL (ref 1.2–3.7)
Lymphocyte %: 9.5 %
MCH: 31 pg/cell (ref 26–32)
MCHC: 35 g/dL (ref 32–36)
MCV: 90 fL (ref 79–95)
Mono # K/uL: 0.8 10*3/uL (ref 0.2–0.9)
Monocyte %: 3.7 %
Neut # K/uL: 18.2 10*3/uL — ABNORMAL HIGH (ref 1.6–6.1)
Nucl RBC # K/uL: 0 10*3/uL (ref 0.0–0.0)
Nucl RBC %: 0 /100 WBC (ref 0.0–0.2)
Platelets: 230 10*3/uL (ref 160–370)
RBC: 4.8 MIL/uL (ref 3.9–5.2)
RDW: 11.9 % (ref 11.7–14.4)
Seg Neut %: 86 %
WBC: 21.2 10*3/uL — ABNORMAL HIGH (ref 4.0–10.0)

## 2016-07-14 LAB — RUQ PANEL (ED ONLY)
ALT: 14 U/L (ref 0–35)
AST: 25 U/L (ref 0–35)
Albumin: 4.4 g/dL (ref 3.5–5.2)
Alk Phos: 86 U/L (ref 35–105)
Amylase: 32 U/L (ref 28–100)
Bilirubin,Direct: <0.2 mg/dL (ref 0.0–0.3)
Bilirubin,Total: 0.6 mg/dL (ref 0.0–1.2)
Lipase: 16 U/L (ref 13–60)
Total Protein: 7.4 g/dL (ref 6.3–7.7)

## 2016-07-14 LAB — HM HIV SCREENING OFFERED

## 2016-07-14 LAB — BASIC METABOLIC PANEL
Anion Gap: 23 — ABNORMAL HIGH (ref 7–16)
CO2: 15 mmol/L — ABNORMAL LOW (ref 20–28)
Calcium: 9.2 mg/dL (ref 8.8–10.2)
Chloride: 98 mmol/L (ref 96–108)
Creatinine: 0.67 mg/dL (ref 0.51–0.95)
GFR,Black: 136 *
GFR,Caucasian: 118 *
Glucose: 95 mg/dL (ref 60–99)
Lab: 14 mg/dL (ref 6–20)
Potassium: 4.2 mmol/L (ref 3.3–5.1)
Sodium: 136 mmol/L (ref 133–145)

## 2016-07-14 LAB — HOLD SST

## 2016-07-14 LAB — POCT MONO
Lot #: 171270
MONO, POC: NEGATIVE

## 2016-07-14 LAB — STREP A CULTURE, THROAT: Group A Strep Throat Culture: 0

## 2016-07-14 LAB — HOLD GREEN WITH GEL

## 2016-07-14 LAB — PREGNANCY TEST, SERUM: Preg,Serum: NEGATIVE

## 2016-07-14 MED ORDER — BUTAMBEN-TETRACAINE-BENZOCAINE 2-2-14 % EX AERO *I*
1.0000 | INHALATION_SPRAY | CUTANEOUS | Status: DC | PRN
Start: 2016-07-14 — End: 2016-07-14

## 2016-07-14 MED ORDER — LIDOCAINE-EPINEPHRINE 1 %-1:100000 IJ SOLN *I*
20.0000 mL | Freq: Once | INTRAMUSCULAR | Status: AC
Start: 2016-07-14 — End: 2016-07-15
  Filled 2016-07-14: qty 20

## 2016-07-14 MED ORDER — BUTAMBEN-TETRACAINE-BENZOCAINE 2-2-14 % EX AERO *I*
1.0000 | INHALATION_SPRAY | Freq: Two times a day (BID) | CUTANEOUS | Status: DC | PRN
Start: 2016-07-14 — End: 2016-07-15
  Administered 2016-07-14: 1 via TOPICAL
  Filled 2016-07-14: qty 20

## 2016-07-14 MED ORDER — METHYLPREDNISOLONE SOD SUCC 125 MG IJ SOLR(62.5MG/ML) *WRAPPED*
125.0000 mg | Freq: Once | INTRAMUSCULAR | Status: AC
Start: 2016-07-14 — End: 2016-07-14
  Administered 2016-07-14: 125 mg via INTRAMUSCULAR

## 2016-07-14 MED ORDER — SODIUM CHLORIDE 0.9 % IV SOLN WRAPPED *I*
125.0000 mL/h | Status: DC
Start: 2016-07-14 — End: 2016-07-15
  Administered 2016-07-14 – 2016-07-15 (×7): 125 mL/h via INTRAVENOUS

## 2016-07-14 MED ORDER — AMPICILLIN-SULBACTAM IN NS 3 GM *I*
3000.0000 mg | Freq: Once | INTRAMUSCULAR | Status: AC
Start: 2016-07-14 — End: 2016-07-14
  Administered 2016-07-14: 3000 mg via INTRAVENOUS
  Filled 2016-07-14: qty 100

## 2016-07-14 MED ORDER — AMPICILLIN-SULBACTAM IN NS 3 GM *I*
3000.0000 mg | Freq: Four times a day (QID) | INTRAMUSCULAR | Status: DC
Start: 2016-07-14 — End: 2016-07-15
  Administered 2016-07-14 – 2016-07-15 (×3): 3000 mg via INTRAVENOUS
  Filled 2016-07-14 (×3): qty 100

## 2016-07-14 MED ORDER — ACETAMINOPHEN 500 MG PO TABS *I*
1000.0000 mg | ORAL_TABLET | Freq: Three times a day (TID) | ORAL | Status: DC | PRN
Start: 2016-07-14 — End: 2016-07-15
  Administered 2016-07-15: 500 mg via ORAL
  Filled 2016-07-14: qty 2

## 2016-07-14 MED ORDER — SODIUM CHLORIDE 0.9 % IV BOLUS *I*
1000.0000 mL | Freq: Once | Status: AC
Start: 2016-07-14 — End: 2016-07-14
  Administered 2016-07-14: 1000 mL via INTRAVENOUS

## 2016-07-14 NOTE — ED Notes (Signed)
Bed: PA-05  Expected date: 07/14/16  Expected time: 3:01 PM  Means of arrival: *Ambulatory / Walk-In  Comments:  ADULT CALL-IN    Patient Tammy Fleming, Tammy Fleming    MRN: [id number]    AGE:30    DOB: 1986-06-02    PCP/Service Referral: [facility]    Patient Information Note: PT WAS SEEN EARLIER IN THE WEEK FOR SORE THROAT, HAS BEEN ON ABX, BUT NOW C/O DIFFICULTY SWALLOWING, CONCERN FOR PERITONSILLAR ABSCESS.  AIRWAY PATENT AT THIS TIME, GIVEN 125 MG SOLUMEDROL, MONO NEGATIVE.    Tests/Orders Requested:      Vital Signs:    Relevant Medications:    Requested Evaluation XB:JYNWGBy:ADULT ED    MD Requesting Call Back: NO    IF CALL BACK REQUESTED:    Notify:   At:    Is caller requesting admission for this patient?: NO    If yes, to which service?    Is referring physician an [facility] provider?NO        Call reported to:AMB TRIAGE    Author Ivory BroadLoretta M Jolena Kittle, RN as of 07/14/2016 at 3:01 PM

## 2016-07-14 NOTE — ED Provider Notes (Signed)
Patient is a pleasant 30yo female who presents with a sore throat that started on Wednesday. Patient noticed white patches in her throat at this time and went to UC where she had a neg rapid strep and was diagnosed with tonsillitis and sent home on Cefdinir. Her ST progressed through today and she looked this afternoon and noticed the "infection was spreading" She is not tolerating food, speaking hurts - is tolerating secretions. Is currently sexually active (oral).        History     Chief Complaint   Patient presents with    Abscess       History provided by:  Patient  Language interpreter used: No      History reviewed. No pertinent past medical history.     History reviewed. No pertinent surgical history.  History reviewed. No pertinent family history.    Social History    reports that she has never smoked. She has never used smokeless tobacco. She reports that she drinks alcohol. She reports that she does not use illicit drugs. Her sexual activity history is not on file.    Living Situation     Questions Responses    Patient lives with Family    Homeless No    Caregiver for other family member No    External Services None    Employment Employed    Domestic Violence Risk No          Problem List   There is no problem list on file for this patient.      Review of Systems   Review of Systems   Constitutional: Positive for chills and fever.   HENT: Positive for sore throat and trouble swallowing. Negative for dental problem, drooling and voice change.    Respiratory: Negative for shortness of breath.    Endocrine: Negative for cold intolerance.   Genitourinary: Negative for difficulty urinating.   Neurological: Negative for dizziness.       Physical Exam     ED Triage Vitals   BP Heart Rate Heart Rate (via Pulse Ox) Resp Temp Temp src SpO2 (Retired) O2 Device O2 Flow Rate   07/14/16 1559 07/14/16 1559 -- 07/14/16 1559 07/14/16 1559 07/14/16 1559 07/14/16 1559 -- --   115/75 115  18 36.8 C (98.2 F) TEMPORAL 100 %         Weight           07/14/16 1559           62.6 kg (138 lb)                    Physical Exam   Constitutional: She is oriented to person, place, and time. She appears well-developed and well-nourished.   Pleasant, interactive, Right tonsillar swelling and exudate.    HENT:   Head: Normocephalic and atraumatic.   Right tonsillar swelling and exudate > L.    Eyes: EOM are normal.   Neck: Normal range of motion.   Cardiovascular: Normal rate and regular rhythm.    Pulmonary/Chest: Effort normal and breath sounds normal. No respiratory distress.   Abdominal: Soft. Bowel sounds are normal.   Musculoskeletal: Normal range of motion.   Neurological: She is alert and oriented to person, place, and time.   Skin: Skin is warm.   Psychiatric: She has a normal mood and affect.       Medical Decision Making        Initial Evaluation:  ED First Provider Contact  Date/Time Event User Comments    07/14/16 1556 ED First Provider Contact Glennon MacWEBER, MARY ANN Initial Face to Face Provider Contact          Patient seen by me as above    Assessment:      Patient is a pleasant 30yo female who presents with a sore throat that started on Wednesday. Patient noticed white patches in her throat at this time and went to UC where she had a neg rapid strep and was diagnosed with tonsillitis and sent home on Cefdinir. Her ST progressed through today and she looked this afternoon and noticed the "infection was spreading" She is not tolerating food, speaking hurts - is tolerating secretions. Is currently sexually active (oral).      Differential Diagnosis includes pharyngitis, GC, abscess.     Plan:     1. Right > Left tonsillar swelling and exudate   - Seen by ENT who confirmed there is tonsillar swelling and exudate, no abscess  - IV fluids 125cc/hr overnight  - IV Unasyn   - Check GC throat culture   - Cetacaine spray PRN, Tylenol PRN  - Soft diet, likely discharge in am    Supervising physician Dr Marya Landryaymond Ochrym was immediately  available.        Herschell DimesSarah Shanikqua Zarzycki, PA           Herschell DimesSampson, Nikya Busler, GeorgiaPA  07/14/16 [phone]

## 2016-07-14 NOTE — ED Triage Notes (Signed)
[facility] On PO cefdinir. Handling secretions.       Triage Note   Jamse Belfastachel Becka Lagasse, RN

## 2016-07-14 NOTE — ED Notes (Signed)
Pt stating she does not want to be woken up for 0100 vitals, states, " I have not been able to sleep these past nights because of pain." Provider aware, ok to bypass 0100 vitals. Call light within reach, will continue to monitor.

## 2016-07-14 NOTE — First Provider Contact (Signed)
ED First Provider Contact Note    Initial provider evaluation performed by   ED First Provider Contact     Date/Time Event User Comments    07/14/16 1556 ED First Provider Contact Syrena Burges, [facility] Initial Face to Face Provider Contact        Diagnosed with tonsillitis 2 days ago, worsening symptoms, no po, Right PTA  Vital signs reviewed.    Orders placed:  LABS and NPO, normal saline     Patient requires further evaluation.     Nohea Kras ANN MortonWEBER, NP, 07/14/2016, 3:56 PM    Collaborating physician Dr Thresa Ross'Connor was immediately available     Valarie ConesWeber, Chales AbrahamsMary Ann, NP  07/14/16 1559

## 2016-07-14 NOTE — Discharge Instructions (Signed)
Please go immediately by private vehicle, driven by your friend[facility]relative, [facility].  Please do not eat or drink anything on the way, and please proceed directly.

## 2016-07-14 NOTE — ED Notes (Signed)
Writer assumed care at this time, will continue to monitor.

## 2016-07-14 NOTE — UC Provider Note (Signed)
History     Chief Complaint   Patient presents with    Sore Throat     Pt. states she was started on antibiotics for sore throat on Wednesday and states it is not improving.  Pt. took one dose Wednesday night, first full dose was yesterday     HPI Comments: Patient is a 30 year old female who presents with persistent and worsening right-sided sore throat with pain with swallowing/speaking after she was seen on 07/12/16.  Patient was seen 2 days ago, with small spot of exudate along her right tonsil, and diagnosed with tonsillitis, placed on Ceftin ear.  Patient states her prescribed antibiotics have not improved or symptoms whatsoever, and she has been having worsening right-sided tonsillar swelling, pain, and worsening pus/exudate.  She also states she's been having a fever of approximately 101-102F persistently at home even after frequent ibuprofen use.  She states it hurts to talk, swallow, and somewhat feels difficult to breathe due to the swelling in her throat, but denies any true dyspnea, shortness of breath, wheezing, or stridor.  She states she last took 400 mg ibuprofen and around 10 AM.      History provided by:  Patient  Language interpreter used: No        History reviewed. No pertinent past medical history.         History reviewed. No pertinent surgical history.    History reviewed. No pertinent family history.      Social History    reports that she has never smoked. She has never used smokeless tobacco. She reports that she does not drink alcohol or use illicit drugs. Her sexual activity history is not on file.    Living Situation     Questions Responses    Patient lives with Family    Homeless No    Caregiver for other family member No    External Services None    Employment Employed    Domestic Violence Risk No          Review of Systems   Review of Systems   Constitutional: Positive for fever. Negative for activity change, appetite change, chills and fatigue.   HENT: Positive for ear pain  (right side), sore throat, trouble swallowing and voice change. Negative for congestion, drooling, ear discharge, mouth sores, postnasal drip, sinus pressure, sneezing and tinnitus.    Eyes: Negative for discharge, redness and itching.   Respiratory: Negative for cough, choking, shortness of breath, wheezing and stridor.    Cardiovascular: Negative for chest pain.   Gastrointestinal: Negative for abdominal pain, diarrhea, nausea and vomiting.   Musculoskeletal: Negative for arthralgias, myalgias and neck stiffness.   Skin: Negative for rash.   Neurological: Negative for dizziness, light-headedness and headaches.   Hematological: Positive for adenopathy.       Physical Exam     ED Triage Vitals   BP Heart Rate Heart Rate (via Pulse Ox) Resp Temp Temp src SpO2 (Retired) O2 Device O2 Flow Rate   07/14/16 1400 07/14/16 1400 -- 07/14/16 1400 07/14/16 1400 07/14/16 1400 07/14/16 1400 -- --   111/65 100  18 37 C (98.6 F) TEMPORAL 98 %        Weight           --                               Physical Exam   Constitutional: She appears well-developed and  well-nourished. She is active.  Non-toxic appearance. She appears ill. No distress.   HENT:   Head: Normocephalic and atraumatic.   Right Ear: Hearing, external ear and ear canal normal. A middle ear effusion (mild serous) is present.   Left Ear: Hearing, tympanic membrane, external ear and ear canal normal.   Nose: Nose normal.   Mouth/Throat: Mucous membranes are normal. No oral lesions. No trismus in the jaw. No uvula swelling. Oropharyngeal exudate, posterior oropharyngeal edema and posterior oropharyngeal erythema present. No tonsillar abscesses. Tonsils are 4+ on the right. Tonsils are 2+ on the left. Tonsillar exudate.       Neck: Normal range of motion. Neck supple.   Cardiovascular: Regular rhythm and normal heart sounds.  Tachycardia present.    Mild tachycardia   Pulmonary/Chest: Effort normal. No respiratory distress. She has no wheezes. She has no rales.      Lymphadenopathy:     She has no cervical adenopathy.   Neurological: She is alert.   Skin: Skin is warm and dry.   Nursing note and vitals reviewed.       Medical Decision Making      Amount and/or Complexity of Data Reviewed  Clinical lab tests: ordered and reviewed  Review and summarize past medical records: yes        Initial Evaluation:  ED First Provider Contact     Date/Time Event User Comments    07/14/16 1406 ED First Provider Contact Valarie ConesPOLLA, Avice Funchess Initial Face to Face Provider Contact          Patient was seen on: 07/14/2016        Assessment:  30 y.o.female comes to the Urgent Care Center with Prior diagnosis 2 days ago of right-sided tonsillitis, treated with Cefdinir oral antibiotics, with no improvement, and worsening right-sided tonsillar pain, swelling, erythema, exudate, with worsening pain with swallowing/talking/breathing.  Airway is currently patent and unobstructed, but patient is ill in appearance, afebrile with stable vital signs currently.    Differential Diagnosis includes   Peritonsillar abscess  Refractory tonsillitis  Infectious mononucleosis  Acute Viral Pharyngitis  Acute Allergic Pharyngitis  Acute Bacterial Pharyngitis  Acute Streptococcal Pharyngitis  Acute URI NOS                  Plan:   Orders Placed This Encounter    POCT mono    methylPREDNISolone sodium succinate (Solu-MEDROL) 62.5 mg/mL injection 125 mg       Recent Results (from the past 24 hour(s))   POCT mono    Collection Time: 07/14/16  2:44 PM   Result Value Ref Range    MONO, POC Negative Negative    INTERNAL CONTROL POCT MONO *Yes-internal procedural control(s) acceptable     Expiration Date 12/14/2016     Lot # [id number]        Patient received 125 mg of IM Solu-medol in clinic with good effect and no immediate complications, Given in order aid in reduction of edema/swelling adjacent to patient's airway prior to transfer to emergency department.      Final Diagnosis    ICD-10-CM ICD-9-CM   1. Peritonsillar abscess J36  475     Due to the patient's Worsening/evolving symptoms, now with exam presentation indicating acute right-sided peritonsillar abscess, the patient is advised to get further evaluation and workup higher level of care, likely for bedside ENT consult and potentially aspiration/drainage of right-sided PTA.    Potential acuity of patient's symptoms explained to patient, who expresses verbal understanding  and agreement.    Patient will be transferred by private vehicle, driven by relative/family member [facility].     Instructions      Please go immediately by private vehicle, driven by your friend/relative, to [facility] emergency department.  Please do not eat or drink anything on the way, and please proceed directly.        Triage report called by nursing.    Encourage fluids, encourage rest, good hand hygiene.    Use over the counter medications as discussed.    Please start the new medications as below:    Current Discharge Medication List          Please follow up with your physician as below:        Thank you Gweneth FritterSamantha Bader for choosing [facility] urgent care for your health concerns.    If your condition changes and/or worsens, please follow up with your primary care provider or return to [facility] urgent care for further evaluation.    If short of breath, chest pains or any other concerns please report to the emergency room.    In the event of an Emergency dial 911.      Final Diagnosis  Final diagnoses:   [J36] Peritonsillar abscess - right (Primary)           Merrily BrittleAndrew K Daundre Biel, PA    Supervising physician Lissa MoralesMatthew Capuano, MD was immediately available     Merrily Brittleolla, Zophia Marrone K, [place]  07/14/16 1455

## 2016-07-14 NOTE — ED Notes (Addendum)
Pt tolerated jello, pudding, and mashed potato. Pt does endorse continued pain with swallowing that improved with cetacaine spray. Handling secretions. Will continue to monitor and treat per orders.

## 2016-07-14 NOTE — ED Notes (Signed)
Plan of Care     Vitals[facility], pain control prn, monitor airway and breathing, monitor oral temps, CG culture, comfort and safety, dental soft diet as tolerated. OBS provider to follow.

## 2016-07-14 NOTE — ED Notes (Signed)
Pt presents to the ED with an abscess on her tonsil that pt states is obstructing her airway, pt presents here from urgent care stating she needs the abscess taken out.

## 2016-07-14 NOTE — ED Notes (Signed)
Pt denies any pain, fever, chills, difficulty swallowing. Pt handling own secretions well. Pt updated on plan of care for 0500 VS and IV abx, pt verbalized understanding. Will continue to monitor.

## 2016-07-14 NOTE — ED Notes (Signed)
Pt arrived from ED WR to OU1 via wheelchair, independent transfer to bed with a steady gait. Pt is alert and oriented x4, endorses 7/10 sharp throat pain when swallowing. Erythema and swelling of right tonsil noted on inspection, patent airway. Handling secretions. Pt updated on plan of care, call light system, oriented to unit--verbalized understanding. PIV flushed, treated per MAR. Call light within reach, will continue to monitor.

## 2016-07-14 NOTE — Consults (Signed)
OTOLARYNGOLOGY HEAD AND NECK SURGERY   CONSULT NOTE    Consult Requested By: ED Obs  Consult Question: PTA    Chief Complaint: Sore throat    History of Presenting Illness:   30 y.o. female otherwise healthy, presents with 5 days of sore throat. Symptoms worsened on Wednesday prompting a visit to Urgent Care. She was diagnosed with tonsillitis and prescribed cefdinir. Patient noted symptoms worsened over the next few days to include fever, odynophagia and mild muffled voice prompting visit to UC again today. She was transferred from urgent care with concern for peritonsillar abscess.    She denies choking or gagging on food. She denies episodes like this in the past, no history of recurrent tonsillitis. She has no problems breathing. She is tolerating her secretions readily.    Past Medical History:  History reviewed. No pertinent past medical history.    Past Surgical History:   History reviewed. No pertinent surgical history.    Social History:  Social History     Social History    Marital status: Single     Spouse name: N/A    Number of children: N/A    Years of education: N/A     Occupational History    Receptionist      Social History Main Topics    Smoking status: Never Smoker    Smokeless tobacco: Never Used    Alcohol use Yes      Comment: rarely    Drug use: No    Sexual activity: Not Asked     Other Topics Concern    None     Social History Narrative       Allergies:   No Known Allergies (drug, envir, food or latex)    Pertinent Outpatient Medications:  n/a    Review of Systems:  A 12-point review of system was reviewed.  Pertinent positives and negatives noted in HPI.    Physical Examination:  BP 106/67 (BP Location: Right arm)   Pulse 99   Temp 36.8 C (98.2 F) (Temporal)    Resp 16   Ht 1.549 m (5\' 1" )   Wt 62.6 kg (138 lb)   SpO2 98%   BMI 26.07 kg/m2       Gen: Awake, alert, breathing easily, NAD, voice mildly muffled.  Nose: Nares clear anteriorly.  OC/OP: Good dentition, Some erythema of  posterior oropharynx. Mild swelling of right soft palate, no induration or fluctuance. Right tonsil 2+ with white exudate covering entire surface. Left tonsil erythematous, 2+.  Neck: Soft, non-tender, shotty lymphadenopathy bilaterally.  Face: No asymmetry, no parotid masses.  Neuro: CN II-XII grossly intact.    Laboratory Data:  WBC 21.2  Mono, poc - negative  Strep negative    Imaging Data:   n/a    Assessment:   30 y.o. female with tonsillitis. There is no evidence of PTA on today's examination. Agree with switch to broader spectrum antibiotic.     Plan:  -No acute ENT intervention  -Would recommend transition to augment in AM, 7 day course  -Dispo per primary    Tammy Fleming Tammy Jazzalyn Loewenstein, MD on 07/14/2016 at 7:21 PM    Otolaryngology Head and Neck Surgery    Please page the ENT specialty resident on call with any questions

## 2016-07-15 MED ORDER — AMOXICILLIN-POT CLAVULANATE 875-125 MG PO TABS *I*
1.0000 | ORAL_TABLET | Freq: Two times a day (BID) | ORAL | 0 refills | Status: DC
Start: 2016-07-15 — End: 2016-07-20

## 2016-07-15 NOTE — ED Notes (Signed)
Plan of Care     Discharge education

## 2016-07-15 NOTE — ED Notes (Signed)
Plan of Care     IVF  IV abx   [facility]  Monitor vs  Monitor airway  Labs as ordered  Comfort measures  Dental soft diet

## 2016-07-15 NOTE — ED Notes (Signed)
Pt discharged home via self. IV removed. Pt appropriately dressed for discharge. All pt belongings returned to pt.     Writer reviewed all discharge paperwork and instructions - pt verbalized understanding.     Britt BoozerEmily A Ayat Drenning, RN 07/15/2016 12:55 PM

## 2016-07-15 NOTE — ED Obs Notes (Signed)
ED OBSERVATION DISCHARGE NOTE    Patient seen by me today, 07/15/2016 at 12:05 PM.    Current patient status: Observation    Subjective:      The patient states that she is feeling "much better" as compared to yesterday, able to talk normally today without new complaints.  No dysphagia, odynophagia, trismus, or other new complaints.  No fever/chills, rigors.     Chief Complaint   Patient presents with    Abscess     Last Nursing documented pain:  0-10 Scale: 5 (07/15/16 0835)    Vitals:    Patient Vitals for the past 24 hrs:   BP Temp Temp src Pulse Resp SpO2 Height Weight   07/15/16 0835 105/56 36 C (96.8 F) Oral 92 18 97 % - -   07/15/16 0555 90/53 37 C (98.6 F) Oral 84 16 96 % - -   07/14/16 2117 111/65 36.9 C (98.4 F) TEMPORAL 94 16 98 % - -   07/14/16 1714 106/67 36.8 C (98.2 F) TEMPORAL 99 16 98 % - -   07/14/16 1559 115/75 36.8 C (98.2 F) TEMPORAL (!) 115 18 100 % 1.549 m (5' 1") 62.6 kg (138 lb)     Physical Exam:  Physical Exam   Constitutional: She is oriented to person, place, and time. She appears well-developed and well-nourished. No distress.   The patient is in no acute distress.  Appears nontoxic.     HENT:   Head: Normocephalic and atraumatic.   Right Ear: External ear normal.   Left Ear: External ear normal.   Nose: Nose normal.   Mouth/Throat: Uvula is midline. She does not have dentures. No trismus in the jaw. No dental abscesses or uvula swelling. Tonsils are 3+ on the right. Tonsils are 1+ on the left. Tonsillar exudate.       Managing secretions, swallowing without difficulty, no stridor, swallows with flexed neck.     Eyes: Conjunctivae and EOM are normal. Right eye exhibits no discharge. Left eye exhibits no discharge. No scleral icterus.   Neck: Normal range of motion. Neck supple. No tracheal deviation present.   Cardiovascular: Normal rate, regular rhythm and normal heart sounds.    Pulmonary/Chest: Effort normal and breath sounds normal. No stridor. No respiratory distress.  She has no wheezes. She exhibits no tenderness.   Abdominal: Soft. Bowel sounds are normal. She exhibits no distension. There is no tenderness. There is no guarding.   Musculoskeletal: Normal range of motion. She exhibits no edema, tenderness or deformity.   The patient is moving all extremities well and with purpose.  The patient is ambulatory via steady gait.     Neurological: She is alert and oriented to person, place, and time. She exhibits normal muscle tone.   The patient exhibits no evidence to suggest acute neurosurgical emergency.  Appropriately interactive with no notable new focal deficits.    Skin: Skin is warm and dry. No rash noted. She is not diaphoretic. No erythema. No pallor.   Skin is well-perfused; pink, warm and dry.    Psychiatric: She has a normal mood and affect. Judgment and thought content normal.   Appropriate and interactive.    Nursing note and vitals reviewed.    Labs:   All labs in the last 24 hours:   Recent Results (from the past 24 hour(s))   POCT mono    Collection Time: 07/14/16  2:44 PM   Result Value Ref Range    MONO, POC Negative  Negative    INTERNAL CONTROL POCT MONO *Yes-internal procedural control(s) acceptable     Expiration Date 12/14/2016     Lot # [id number]    CBC and differential    Collection Time: 07/14/16  4:14 PM   Result Value Ref Range    WBC 21.2 (H) 4.0 - 10.0 THOU/uL    RBC 4.8 3.9 - 5.2 MIL/uL    Hemoglobin 15.0 11.2 - 15.7 g/dL    Hematocrit 43 34 - 45 %    MCV 90 79 - 95 fL    MCH 31 26 - 32 pg/cell    MCHC 35 32 - 36 g/dL    RDW 11.9 11.7 - 14.4 %    Platelets 230 160 - 370 THOU/uL    Seg Neut % 86.0 %    Lymphocyte % 9.5 %    Monocyte % 3.7 %    Eosinophil % 0.1 %    Basophil % 0.2 %    Neut # K/uL 18.2 (H) 1.6 - 6.1 THOU/uL    Lymph # K/uL 2.0 1.2 - 3.7 THOU/uL    Mono # K/uL 0.8 0.2 - 0.9 THOU/uL    Eos # K/uL 0.0 0.0 - 0.4 THOU/uL    Baso # K/uL 0.1 0.0 - 0.1 THOU/uL    Nucl RBC % 0.0 0.0 - 0.2 /100 WBC    Nucl RBC # K/uL 0.0 0.0 - 0.0 THOU/uL    IMM  Granulocytes # 0.1 0.0 - 0.1 THOU/uL    IMM Granulocytes 0.5 %   Basic metabolic panel    Collection Time: 07/14/16  4:14 PM   Result Value Ref Range    Glucose 95 60 - 99 mg/dL    Sodium 136 133 - 145 mmol/L    Potassium 4.2 3.3 - 5.1 mmol/L    Chloride 98 96 - 108 mmol/L    CO2 15 (L) 20 - 28 mmol/L    Anion Gap 23 (H) 7 - 16    UN 14 6 - 20 mg/dL    Creatinine 0.67 0.51 - 0.95 mg/dL    GFR,Caucasian 118 *    GFR,Black 136 *    Calcium 9.2 8.8 - 10.2 mg/dL   Hold SST    Collection Time: 07/14/16  4:14 PM   Result Value Ref Range    Hold SST HOLD TUBE    RUQ panel    Collection Time: 07/14/16  4:14 PM   Result Value Ref Range    Amylase 32 28 - 100 U/L    Lipase 16 13 - 60 U/L    Total Protein 7.4 6.3 - 7.7 g/dL    Albumin 4.4 3.5 - 5.2 g/dL    Bilirubin,Total 0.6 0.0 - 1.2 mg/dL    Bilirubin,Direct <0.2 0.0 - 0.3 mg/dL    Alk Phos 86 35 - 105 U/L    AST 25 0 - 35 U/L    ALT 14 0 - 35 U/L   HCG, serum qualitative, pregnancy    Collection Time: 07/14/16  4:14 PM   Result Value Ref Range    Preg,Serum NEG NEGATIVE   Hold green with gel    Collection Time: 07/14/16  5:02 PM   Result Value Ref Range    Hold Green (w/gel,spun) HOLD TUBE    GC culture    Collection Time: 07/14/16  6:54 PM   Result Value Ref Range    GC Culture .      Imaging findings: NA  Consults: ENT    Assessment:     30 year old female referred to ED observation from urgent care yesterday for concern of a peritonsillar abscess.  Was seen and evaluated by ENT and diagnosed with tonsillitis and does not require incision and drainage.  Was found to have a leukocytosis after receiving a dose of decadron at urgent care.     Large improvement in symptoms after IV Unasyn.  Without evidence of systemic infection at this time.  Ready for transition to PO antibiosis.     Plan:  1.  Tonsillitis:   -Will transition to PO Augmentin 875/125 BID x 6 remaining days.   -GC throat culture pending.   -Can follow-up with ENT prn; follow-up with PCP to be arranged by  the patient.   -Return precautions discussed in detail with the patient.   -All questions answered to satisfaction.   -APAP/Ibuprofen as needed per the manufacturer's instructions for pain.     Disposition: Home  Follow-up:  as soon as possible. with primary care physician.   Smoking Cessation: NA    Diagnoses that have been ruled out:   None   Diagnoses that are still under consideration:   None   Final diagnoses:   Tonsillitis     Author: Barnie Del, PA  Note created: 07/15/2016  at: 12:05 PM    Supervising physician Franki Monte, MD was immediately available     Evlyn Courier, [place]  07/15/16 1205

## 2016-07-15 NOTE — Discharge Instructions (Signed)
You were seen and evaluated in the emergency department for: tonsillitis.     Instructions:   1.  You were seen and evaluated for swollen tonsils.  You improved after receiving IV antibiotics and are now ready for transition to by mouth antibiotics.   2.  Please take all of your home medications as you normally would.   3.  Please take the antibiotic prescription as prescribed and finish the entirety of the prescription even if you begin to feel better.   4.  Please follow all included discharge instructions and return to ED precautions.   5.  You can use Ibuprofen and Tylenol as needed for pain.     Medications:  -Please take all of your home medications as you normally would.   -Please take Augmentin as prescribed for the next 6 days.      Follow-Up:  -Please call your primary care doctor as soon as possible to make a follow-up appointment from today's visit.      Return to Emergency Department:  -We are always happy to re-evaluate you.  Return to the emergency department with any: fever, chills, nausea, vomiting, shortness of breath, wheezing, feeling of throat closing, or any other symptoms which concern you.

## 2016-07-15 NOTE — ED Notes (Signed)
Pt denies nausea, SOB, dizziness. Pt reported 5/10 headache pain - Clinical research associatewriter gave Tylenol per request. Pt only wanted to take 500 mg of Tylenol because that too much Tylenol makes her nauseous.     IV fluids running. Telemetry on.    Pt resting comfortably in bed.     Britt BoozerEmily A Odysseus Cada, RN 07/15/2016 11:59 AM

## 2016-07-18 LAB — GC CULTURE: GC Culture: 0

## 2016-07-20 ENCOUNTER — Ambulatory Visit
Admission: AD | Admit: 2016-07-20 | Discharge: 2016-07-20 | Disposition: A | Discharge status: Home / Self Care | Payer: Self-pay

## 2016-07-20 ENCOUNTER — Encounter: Payer: Self-pay | Admitting: Family

## 2016-07-20 DIAGNOSIS — R509 Fever, unspecified: Secondary | ICD-10-CM

## 2016-07-20 DIAGNOSIS — J111 Influenza due to unidentified influenza virus with other respiratory manifestations: Secondary | ICD-10-CM

## 2016-07-20 LAB — POCT URINALYSIS DIPSTICK
Glucose,UA POCT: NORMAL mg/dL
Lot #: 21650803
Nitrite,UA POCT: NEGATIVE
PH,UA POCT: 8 (ref 5–8)
Specific gravity,UA POCT: 1.005 (ref 1.002–1.030)

## 2016-07-20 LAB — POCT AMBULATORY RAPID STREP
Lot #: 171353
Rapid Strep Group A Throat-POC: NEGATIVE

## 2016-07-20 LAB — INFLUENZA  A & B/RSV PCR: Influenza A&B/RSV PCR: POSITIVE — AB

## 2016-07-20 MED ORDER — OSELTAMIVIR PHOSPHATE 75 MG PO CAPS *I*
75.0000 mg | ORAL_CAPSULE | Freq: Two times a day (BID) | ORAL | 0 refills | Status: AC
Start: 2016-07-20 — End: 2016-07-25

## 2016-07-20 MED ORDER — BENZONATATE 200 MG PO CAPS *I*
200.0000 mg | ORAL_CAPSULE | Freq: Three times a day (TID) | ORAL | 0 refills | Status: DC | PRN
Start: 2016-07-20 — End: 2016-08-27

## 2016-07-20 MED ORDER — ACETAMINOPHEN 325 MG PO TABS *I*
650.0000 mg | ORAL_TABLET | Freq: Once | ORAL | Status: AC
Start: 2016-07-20 — End: 2016-07-20
  Administered 2016-07-20: 650 mg via ORAL

## 2016-07-20 NOTE — Discharge Instructions (Signed)
Take Tamiflu as prescribed.  An influenza swab has been sent to the lab.  If it is positive you'll be contacted.  Use Tessalon Perles as needed for cough.  Rest and drink plenty of fluids.  If appetite is decreased, concentrate on hydration.  Observe good cough and hand hygiene.  Follow-up appointment with your PCP in 2-4 days.  If symptoms worsen, return or go to the ED

## 2016-07-20 NOTE — UC Provider Note (Signed)
History     Chief Complaint   Patient presents with    Fever     pt comes in with fever yesterday and today of about 102,coughing and congestion since yesterday. Recently seen here for tonsil abscess. Took aleve last night for symptoms nothing today.      HPI Comments: 31 year old female presents for evaluation of fever which began yesterday.  The third time she has been seen in the past 10 days.  Her first visit was diagnosed as viral tonsillitis, second visit was peritonsillar abscess to which she was sent to the ED and admitted briefly, today she is day 3 post antibiotics and reports having a fever up to 102 began yesterday.  She endorses nasal congestion, sore throat, as well as a wet sounding cough and myalgias.  She denies any abdominal pain or dysuria.  She has not tried any treatments at home.    Patient is a 31 y.o. female presenting with fever.   History provided by:  Patient  Language interpreter used: No    Fever   Max temp prior to arrival:  102  Temp source:  Oral  Onset quality:  Sudden  Duration:  2 days  Timing:  Constant  Progression:  Unchanged  Chronicity:  New  Relieved by:  Nothing  Worsened by:  Nothing  Associated symptoms: chills, congestion, cough, myalgias and sore throat    Associated symptoms: no chest pain, no confusion, no diarrhea, no dysuria, no headaches, no nausea, no rhinorrhea and no vomiting        History reviewed. No pertinent past medical history.         History reviewed. No pertinent surgical history.    History reviewed. No pertinent family history.      Social History    reports that she has never smoked. She has never used smokeless tobacco. She reports that she drinks alcohol. She reports that she does not use illicit drugs. Her sexual activity history is not on file.    Living Situation     Questions Responses    Patient lives with Family    Homeless No    Caregiver for other family member No    External Services None    Employment Employed    Domestic Violence  Risk No          Review of Systems   Review of Systems   Constitutional: Positive for chills, fatigue and fever.   HENT: Positive for congestion and sore throat. Negative for postnasal drip and rhinorrhea.    Eyes: Negative.    Respiratory: Positive for cough. Negative for shortness of breath and wheezing.    Cardiovascular: Negative for chest pain and palpitations.   Gastrointestinal: Negative for diarrhea, nausea and vomiting.   Genitourinary: Negative for dysuria.   Musculoskeletal: Positive for myalgias. Negative for arthralgias.   Skin: Negative.    Neurological: Negative for dizziness, light-headedness and headaches.   Hematological: Negative.    Psychiatric/Behavioral: Negative.  Negative for confusion.       Physical Exam       ED Triage Vitals   BP Heart Rate Heart Rate (via Pulse Ox) Resp Temp Temp src SpO2 (Retired) O2 Device O2 Flow Rate   07/20/16 1306 07/20/16 1306 -- 07/20/16 1306 07/20/16 1306 07/20/16 1306 07/20/16 1306 -- --   132/74 125  18 38.8 C (101.8 F) TEMPORAL 100 %        Weight           --  Physical Exam   Constitutional: She is oriented to person, place, and time. She appears well-developed and well-nourished. She appears ill. No distress.   HENT:   Head: Normocephalic and atraumatic.   Right Ear: External ear normal.   Left Ear: External ear normal.   Nose: Mucosal edema present. No rhinorrhea.   Mouth/Throat: Uvula is midline and mucous membranes are normal. Posterior oropharyngeal erythema present. No oropharyngeal exudate, posterior oropharyngeal edema or tonsillar abscesses.   Cardiovascular: Regular rhythm and normal heart sounds.    Pulmonary/Chest: Effort normal and breath sounds normal.   Neurological: She is alert and oriented to person, place, and time.   Skin: Skin is warm and dry. She is not diaphoretic. There is pallor.   Psychiatric: She has a normal mood and affect. Her behavior is normal. Judgment and thought content normal.   Nursing note  and vitals reviewed.       Medical Decision Making      Amount and/or Complexity of Data Reviewed  Clinical lab tests: ordered and reviewed        Initial Evaluation:  ED First Provider Contact     Date/Time Event User Comments    07/20/16 1305 ED First Provider Contact Ajanae Virag Initial Face to Face Provider Contact          Patient was seen on: 07/20/2016        Assessment:  30 y.o.female comes to the Urgent Care Center with fever of up to 102 at home for the past two days, recent hospitalization for a peritonsillar abscess    Differential Diagnosis includes Influenza/Influenza like illness/viral syndrome/RSV/parainfluenza                Plan:   Orders Placed This Encounter    Influenza A & B/RSV PCR    *Chest standard frontal and lateral views    POCT rapid strep    POCT urinalysis dipstick    acetaminophen (TYLENOL) tablet 650 mg    oseltamivir (TAMIFLU) 75 MG capsule    benzonatate (TESSALON) 200 MG capsule   Chest x-ray negative for acute disease.  Rapid strep and urinalysis negative.  Take Tamiflu as prescribed.  An influenza swab has been sent to the lab.  If it is positive you'll be contacted.  Use Tessalon Perles as needed for cough.  Rest and drink plenty of fluids.  If appetite is decreased, concentrate on hydration.  Observe good cough and hand hygiene.  Follow-up appointment with your PCP in 2-4 days.  If symptoms worsen, return or go to the ED  Recent Results (from the past 24 hour(s))   POCT rapid strep    Collection Time: 07/20/16  2:04 PM   Result Value Ref Range    Rapid Strep Group A Throat-POC Negative for Streptococcus Group A Antigen Negative    INTERNAL CONTROL RAPID STREP POCT *Yes-internal procedural control(s) acceptable     Exp date 07/2017     Lot # [id number]    POCT urinalysis dipstick    Collection Time: 07/20/16  2:06 PM   Result Value Ref Range    Specific gravity,UA POCT 1.005 1.002 - 1.030    PH,UA POCT 8.0 5 - 8    Leuk Esterase,UA POCT Trace (!) Negative    Nitrite,UA POCT  Negative Negative    Protein,UA POCT Trace (!) Negative mg/dL    Glucose,UA POCT Normal Normal mg/dL    Ketones,UA POCT ++ moderate Negative mg/dL    Urobilinogen,UA  Less than 1 mg/dL  Bilirubin,Ur  Negative    Blood,UA POCT Trace (!) Negative    Exp date 02/2017     Lot # [id number]          Final Diagnosis    ICD-10-CM ICD-9-CM   1. Influenza J11.1 487.1   2. Fever, unspecified fever cause R50.9 780.60       Encourage fluids, encourage rest, good hand hygiene.    Use over the counter medications as discussed.    Please start the new medications as below:    Current Discharge Medication List      New Medications    Details Last Dose Given Next Dose Due Script Given?   oseltamivir (TAMIFLU) 75 mg Dose: 75 mg  Take 75 mg by mouth 2 times daily  Quantity 10 capsule, Refill 0  Start date: 07/20/2016, End date: 07/25/2016       Comments: Emergency Encounter            benzonatate (TESSALON) 200 mg Dose: 200 mg  Take 200 mg by mouth 3 times daily as needed for Cough    Quantity 30 capsule, Refill 0  Start date: 07/20/2016                   Please follow up with your physician as below:    Follow-up Information     Follow up with Katherine Mantle, MD. Schedule an appointment as soon as   possible for a visit in 2 days.    Specialty:  Family Medicine    Contact information:    [address]  [place] [address]  [phone]          Thank you Gweneth Fritter for coming to [facility] for your health care concerns.    If your condition changes and/or worsens please follow up with her primary doctor and/or return to the urgent care center.    If short of breath, chest pains or any other concerns please report to the emergency room.    In the event of an Emergency dial 911.    Final Diagnosis  Final diagnoses:   [J11.1] Influenza (Primary)   [R50.9] Fever, unspecified fever cause           Alcide Clever, NP    Collaborating physician Jamie Brookes, MD was immediately available     Alcide Clever, NP  07/20/16 1427

## 2016-07-20 NOTE — ED Triage Notes (Signed)
pt comes in with fever yesterday and today of about 102,coughing and congestion since yesterday. Recently seen here for tonsil abscess. Took aleve last night for symptoms nothing today.        Triage Note   Coralie CarpenAriel Brown, RN

## 2016-08-03 ENCOUNTER — Encounter: Payer: Self-pay | Admitting: Emergency Medicine

## 2016-08-03 ENCOUNTER — Ambulatory Visit
Admission: AD | Admit: 2016-08-03 | Discharge: 2016-08-03 | Disposition: A | Discharge status: Other Acute Inpatient Hospital | Payer: Self-pay

## 2016-08-03 ENCOUNTER — Other Ambulatory Visit: Payer: Self-pay | Admitting: Gastroenterology

## 2016-08-03 ENCOUNTER — Emergency Department
Admission: EM | Admit: 2016-08-03 | Discharge: 2016-08-03 | Disposition: A | Discharge status: Home / Self Care | Payer: PRIVATE HEALTH INSURANCE | Source: Ambulatory Visit | Attending: Emergency Medicine | Admitting: Emergency Medicine

## 2016-08-03 ENCOUNTER — Other Ambulatory Visit: Payer: Self-pay | Admitting: Cardiology

## 2016-08-03 DIAGNOSIS — R079 Chest pain, unspecified: Secondary | ICD-10-CM

## 2016-08-03 DIAGNOSIS — R0789 Other chest pain: Secondary | ICD-10-CM | POA: Insufficient documentation

## 2016-08-03 LAB — BASIC METABOLIC PANEL
Anion Gap: 12 (ref 7–16)
CO2: 24 mmol/L (ref 20–28)
Calcium: 9.1 mg/dL (ref 8.8–10.2)
Chloride: 106 mmol/L (ref 96–108)
Creatinine: 0.55 mg/dL (ref 0.51–0.95)
GFR,Black: 145 *
GFR,Caucasian: 126 *
Glucose: 116 mg/dL — ABNORMAL HIGH (ref 60–99)
Lab: 10 mg/dL (ref 6–20)
Potassium: 3.9 mmol/L (ref 3.3–5.1)
Sodium: 142 mmol/L (ref 133–145)

## 2016-08-03 LAB — CBC AND DIFFERENTIAL
Baso # K/uL: 0 10*3/uL (ref 0.0–0.1)
Basophil %: 0.7 %
Eos # K/uL: 0 10*3/uL (ref 0.0–0.4)
Eosinophil %: 0.5 %
Hematocrit: 40 % (ref 34–45)
Hemoglobin: 13.8 g/dL (ref 11.2–15.7)
IMM Granulocytes #: 0 10*3/uL (ref 0.0–0.1)
IMM Granulocytes: 0.3 %
Lymph # K/uL: 1.8 10*3/uL (ref 1.2–3.7)
Lymphocyte %: 30.3 %
MCH: 31 pg (ref 26–32)
MCHC: 34 g/dL (ref 32–36)
MCV: 90 fL (ref 79–95)
Mono # K/uL: 0.6 10*3/uL (ref 0.2–0.9)
Monocyte %: 10.1 %
Neut # K/uL: 3.4 10*3/uL (ref 1.6–6.1)
Nucl RBC # K/uL: 0 10*3/uL (ref 0.0–0.0)
Nucl RBC %: 0 /100 WBC (ref 0.0–0.2)
Platelets: 227 10*3/uL (ref 160–370)
RBC: 4.5 MIL/uL (ref 3.9–5.2)
RDW: 11.8 % (ref 11.7–14.4)
Seg Neut %: 58.1 %
WBC: 5.9 10*3/uL (ref 4.0–10.0)

## 2016-08-03 LAB — POCT URINE PREGNANCY: Lot #: 124065

## 2016-08-03 LAB — HOLD SST

## 2016-08-03 LAB — TROPONIN T: Troponin T: <0.01 ng/mL (ref 0.00–0.02)

## 2016-08-03 LAB — PREGNANCY TEST, SERUM: Preg,Serum: NEGATIVE

## 2016-08-03 LAB — HOLD LAVENDER

## 2016-08-03 LAB — T4, FREE: Free T4: 1.1 ng/dL (ref 0.9–1.7)

## 2016-08-03 LAB — HOLD BLUE

## 2016-08-03 LAB — BLOOD BANK HOLD LAVENDER

## 2016-08-03 LAB — HOLD GREEN WITH GEL

## 2016-08-03 LAB — TSH: TSH: 1.35 u[IU]/mL (ref 0.27–4.20)

## 2016-08-03 MED ORDER — SODIUM CHLORIDE 0.9 % IV BOLUS *I*
1000.0000 mL | Freq: Once | Status: AC
Start: 2016-08-03 — End: 2016-08-03
  Administered 2016-08-03: 1000 mL via INTRAVENOUS

## 2016-08-03 NOTE — Discharge Instructions (Signed)
You are advised to go to ED at [facility] for formal evaluation. You have declined ot go by ambulance and understand this risk.

## 2016-08-03 NOTE — ED Notes (Signed)
Patient to go to [facility] ED for further evaluation. Report called to [facility]. Patient to go by private vehicle.

## 2016-08-03 NOTE — UC Provider Note (Signed)
History     Chief Complaint   Patient presents with    Chest Pain     Patient reports a squeezing pain in her chest the last few nights. Also waking up in sweats.      HPI Comments: 31 y/o female otherwise healthy, presents with 4 day hx of mid sternal CP, left sided,  intermittent in nature. no SOB, but difficulty getting a breath. Not worse with movement, breathing. Hx of nausea, + night sweats. No F/C. No recent cough or illness. Denies family hx. No OCP, no prolonged sitting or long trips. Feels like occasional heart palpitations(racing). Denies previous hx, no hx anxiety.       History provided by:  Patient      History reviewed. No pertinent past medical history.         History reviewed. No pertinent surgical history.    No family history on file.      Social History    reports that she has never smoked. She has never used smokeless tobacco. She reports that she drinks alcohol. She reports that she does not use illicit drugs. Her sexual activity history is not on file.    Living Situation     Questions Responses    Patient lives with Family    Homeless No    Caregiver for other family member No    External Services None    Employment Employed    Domestic Violence Risk No          Review of Systems   Review of Systems   Constitutional: Negative for chills and fever.   HENT: Negative for congestion.    Respiratory: Negative for cough, shortness of breath and wheezing.    Cardiovascular: Positive for chest pain and palpitations.   Gastrointestinal: Positive for nausea. Negative for abdominal pain and vomiting.   Genitourinary: Negative for difficulty urinating.   Musculoskeletal: Negative for arthralgias.   Neurological: Negative for dizziness, syncope, light-headedness and headaches.   Hematological: Does not bruise/bleed easily.   Psychiatric/Behavioral: Negative for confusion.       Physical Exam       ED Triage Vitals   BP Heart Rate Heart Rate (via Pulse Ox) Resp Temp Temp src SpO2 (Retired) O2 Device  O2 Flow Rate   08/03/16 1130 08/03/16 1130 -- 08/03/16 1130 08/03/16 1130 08/03/16 1130 08/03/16 1130 -- --   138/67 60  16 36.6 C (97.9 F) TEMPORAL 97 %        Weight           --                               Physical Exam   Constitutional: She is oriented to person, place, and time. She appears well-developed and well-nourished. No distress.   HENT:   Head: Normocephalic and atraumatic.   Mouth/Throat: Oropharynx is clear and moist.   Eyes: Conjunctivae are normal.   Neck: Neck supple. No JVD present.   Cardiovascular: Normal rate, regular rhythm, normal heart sounds and intact distal pulses.    No murmur heard.  Pulmonary/Chest: Effort normal and breath sounds normal. No respiratory distress. She exhibits tenderness.   Abdominal: Soft. There is no tenderness.   Musculoskeletal: Normal range of motion.   Neurological: She is alert and oriented to person, place, and time.   Skin: Skin is warm and dry.   Psychiatric: She has a normal mood  and affect.   Nursing note and vitals reviewed.       Medical Decision Making      Amount and/or Complexity of Data Reviewed  Tests in the radiology section of CPT: ordered        Initial Evaluation:  ED First Provider Contact     Date/Time Event User Comments    08/03/16 1139 ED First Provider Contact Katherene PontoFENTON, ANGELA E Initial Face to Face Provider Contact          Patient was seen on: 08/03/2016        Assessment:  30 y.o.female comes to the Urgent Care Center with CP left sided, chest wall pain, pressure. Palpitations, night sweats for 4 days in healthy female.    Differential Diagnosis includes   ACS  Arrhythmia  PE  Muscular pain  GERD  Hyperthyroidism                  Plan:   EXAM  EKG  D/W patient concern for her heart, other s/s. Given option of either calling PCP to FU today there or going to ED for formal evaluation. She has elected to go to the ED at [facility] for evaluation. Will go by private vehicle. Report called to [facility].   Orders Placed This Encounter   No orders placed  during this encounter.       No results found for this or any previous visit (from the past 24 hour(s)).        Final Diagnosis    ICD-10-CM ICD-9-CM   1. Chest pain, unspecified type R07.9 786.50       Encourage fluids, encourage rest, good hand hygiene.    Use over the counter medications as discussed.    Please start the new medications as below:    Current Discharge Medication List          Please follow up with your physician as below:        Thank you Gweneth FritterSamantha Hammers for coming to [facility] for your health care concerns.    If your condition changes and/or worsens please follow up with her primary doctor and/or return to the urgent care center.    If short of breath, chest pains or any other concerns please report to the emergency room.    In the event of an Emergency dial 911.      Final Diagnosis  Final diagnoses:   [R07.9] Chest pain, unspecified type (Primary)           Basilia JumboANGIE Hailee Hollick, NP    Collaborating physician Jarrett AblesMatthew Spencer, MD was immediately available     Basilia JumboFenton, Lyndsie Wallman, NP  08/03/16 1158

## 2016-08-03 NOTE — Discharge Instructions (Signed)
You were seen in the ED for complaints of chest pain.    Your labs, EKG, and chest Xray were all normal.     Please follow up with your PCP in the next 3-5 days for re-evaluation.    Come back to the ED for worsening symptoms including worsening chest pain, high fevers, or any other concerning symptoms

## 2016-08-03 NOTE — ED Notes (Signed)
TO XRAY VIA STRETCHER

## 2016-08-03 NOTE — ED Notes (Addendum)
Early Jan  Had tonsill[facility], followed by positive flu test. Allergic reaction to Tamiflu. Now w/ CP x 4 days, night sweats, n/v. Worsens with position- worse lying on back or either side.CP comes and goes. Palpitations occasionally. Continuous cardiac monitoring, EKG, PIV, labs sent

## 2016-08-03 NOTE — ED Provider Notes (Signed)
History     Chief Complaint   Patient presents with    Chest Pain     started 4 days, radiates to left side, intermittant worse at night with nights sweats.      HPI Comments: 31 year old female past medical history of recent tonsillitis, and influenza this reason, presents to the emergency department with complaints of chest pain and pressure.  States the pain started approximately 4 days ago.  Describes the pain as intermittent, reproducible, and located in her left upper chest.  She also endorses associated night sweats with an episode of nausea and emesis this morning.  States she does have a primary care doctor, she has not contacted for symptoms as of yet.  Denies fevers, cough, hemoptysis, shortness of breath.  Denies any recent travel or calf pain.      History provided by:  Patient  Language interpreter used: No      History reviewed. No pertinent past medical history.     History reviewed. No pertinent surgical history.  History reviewed. No pertinent family history.    Social History    reports that she has never smoked. She has never used smokeless tobacco. She reports that she drinks alcohol. She reports that she does not use illicit drugs. Her sexual activity history is not on file.    Living Situation     Questions Responses    Patient lives with Family    Homeless No    Caregiver for other family member No    External Services None    Employment Employed    Domestic Violence Risk No          Problem List   There is no problem list on file for this patient.      Review of Systems   Review of Systems   Constitutional: Positive for diaphoresis. Negative for appetite change, chills, fatigue and fever.   HENT: Negative for congestion, rhinorrhea, sinus pain and tinnitus.    Eyes: Negative for visual disturbance.   Respiratory: Negative for cough, chest tightness, shortness of breath and wheezing.    Cardiovascular: Positive for chest pain and palpitations. Negative for leg swelling.    Gastrointestinal: Positive for nausea and vomiting. Negative for abdominal pain.   Genitourinary: Negative for dysuria, hematuria and vaginal discharge.   Musculoskeletal: Negative for back pain and neck pain.   Skin: Negative for wound.   Allergic/Immunologic: Negative for immunocompromised state.   Neurological: Negative for dizziness, weakness and headaches.   Psychiatric/Behavioral: Negative for agitation and confusion.       Physical Exam     ED Triage Vitals   BP Heart Rate Heart Rate (via Pulse Ox) Resp Temp Temp src SpO2 (Retired) O2 Device O2 Flow Rate   08/03/16 1237 08/03/16 1237 -- 08/03/16 1237 08/03/16 1237 08/03/16 1237 08/03/16 1237 -- --   104/68 86  18 36 C (96.8 F) TEMPORAL 98 %        Weight           08/03/16 1237           58.1 kg (128 lb)                    Physical Exam   Constitutional: She is oriented to person, place, and time. No distress.   HENT:   Head: Normocephalic and atraumatic.   Eyes: Conjunctivae and EOM are normal. Pupils are equal, round, and reactive to light. Right eye exhibits no discharge. Left  eye exhibits no discharge.   Neck: Normal range of motion.   Cardiovascular: Normal rate and regular rhythm.    No murmur heard.  Pulmonary/Chest: Effort normal and breath sounds normal. No respiratory distress. She has no wheezes. She exhibits tenderness.   Tenderness to palpation to Left upper chest   Abdominal: Soft. Bowel sounds are normal. She exhibits no distension. There is no tenderness. There is no guarding.   Musculoskeletal: Normal range of motion. She exhibits no edema or tenderness.   Neurological: She is alert and oriented to person, place, and time.   Skin: Skin is warm and dry. She is not diaphoretic.   Nursing note and vitals reviewed.      Medical Decision Making      Amount and/or Complexity of Data Reviewed  Clinical lab tests: ordered and reviewed  Tests in the radiology section of CPT: ordered and reviewed        Initial Evaluation:  ED First Provider  Contact     Date/Time Event User Comments    08/03/16 1310 ED First Provider Contact Laura Radilla, [facility] Initial Face to Face Provider Contact          Patient seen by me as above    Assessment:  30 y.o.female comes to the ED with complaints of intermittent chest pain and pressure x4 days. Pain is reproducible with palpation. Not Pleuritic in nature. Pt is well appearing, is not tachycardic, tachypnea or hypoxic. Low likelihood of PE. Denies back pain when palpated. VSS. Abdomen is soft, non distended and non tender on exam. Lung sounds clear bilaterally to ausculation.     Differential Diagnosis includes: Chostrochodritis, chest wall pain, pleurisy,atypical chest pain, PE.                       Plan: IV, CBC, BMP, Trop, EKG, Urine preg, Chest Xray    3:10 PM  Update:  Recent Results (from the past 24 hour(s))   Blood bank hold lavender    Collection Time: 08/03/16 12:53 PM   Result Value Ref Range    Bld Bank Hld Lav Lav In Bld Bank    CBC and differential    Collection Time: 08/03/16 12:53 PM   Result Value Ref Range    WBC 5.9 4.0 - 10.0 THOU/uL    RBC 4.5 3.9 - 5.2 MIL/uL    Hemoglobin 13.8 11.2 - 15.7 g/dL    Hematocrit 40 34 - 45 %    MCV 90 79 - 95 fL    MCH 31 26 - 32 pg    MCHC 34 32 - 36 g/dL    RDW 16.1 09.6 - 04.5 %    Platelets 227 160 - 370 THOU/uL    Seg Neut % 58.1 %    Lymphocyte % 30.3 %    Monocyte % 10.1 %    Eosinophil % 0.5 %    Basophil % 0.7 %    Neut # K/uL 3.4 1.6 - 6.1 THOU/uL    Lymph # K/uL 1.8 1.2 - 3.7 THOU/uL    Mono # K/uL 0.6 0.2 - 0.9 THOU/uL    Eos # K/uL 0.0 0.0 - 0.4 THOU/uL    Baso # K/uL 0.0 0.0 - 0.1 THOU/uL    Nucl RBC % 0.0 0.0 - 0.2 /100 WBC    Nucl RBC # K/uL 0.0 0.0 - 0.0 THOU/uL    IMM Granulocytes # 0.0 0.0 - 0.1 THOU/uL    IMM  Granulocytes 0.3 %   Basic metabolic panel    Collection Time: 08/03/16 12:53 PM   Result Value Ref Range    Glucose 116 (H) 60 - 99 mg/dL    Sodium 478142 295133 - 621145 mmol/L    Potassium 3.9 3.3 - 5.1 mmol/L    Chloride 106 96 - 108 mmol/L    CO2 24 20  - 28 mmol/L    Anion Gap 12 7 - 16    UN 10 6 - 20 mg/dL    Creatinine 3.080.55 6.570.51 - 0.95 mg/dL    GFR,Caucasian 846126 *    GFR,Black 145 *    Calcium 9.1 8.8 - 10.2 mg/dL   Troponin T    Collection Time: 08/03/16 12:53 PM   Result Value Ref Range    Troponin T <0.01 0.00 - 0.02 ng/mL   Pregnancy Test, Serum    Collection Time: 08/03/16 12:53 PM   Result Value Ref Range    Preg,Serum NEG NEGATIVE   POCT urine pregnancy    Collection Time: 08/03/16  1:40 PM   Result Value Ref Range    Preg Test,UR POC  Negative-Dilute urine specimens may cause false negative urine pregnancy results...     Negative-Dilute urine specimens may cause false negative urine pregnancy results...    INTERNAL CONTROL POCT URINE PREGNANCY *Yes-internal procedural control(s) acceptable     Exp date 19-oct-18     Lot # [id number]      *Chest STANDARD single view   Final Result   IMPRESSION:        No acute pulmonary disease.       END OF REPORT       [facility] submits this DICOM format image data and final report to    the [facility], an independent secure electronic health    information exchange, on a reciprocally searchable basis (with    patient authorization) for a minimum of 12 months after exam date.        No acute abnormalities noted on imaging or labs at this time.     Perc score indicated that D-dimer is not indicated at this time (Score Zero).    VS remain stable and physical exam unchanged.    Pt advised to follow up with PCP in the next 3-5 days.    Strict return precautions reviewed with pt.   Emelia SalisburyShauna L Freeda Spivey, NP    Collaborating physician Aloisio was immediately available       Emelia SalisburyCurry, Zanna Hawn L, NP  08/03/16 1535

## 2016-08-04 LAB — EKG 12-LEAD
P: 30 deg
P: 41 deg
PR: 124 ms
PR: 120 ms
QRS: 50 deg
QRS: 37 deg
QRSD: 82 ms
QRSD: 82 ms
QT: 368 ms
QT: 360 ms
QTc: 403 ms
QTc: 411 ms
Rate: 72 {beats}/min
Rate: 78 {beats}/min
Severity: BORDERLINE
Severity: BORDERLINE
Statement: BORDERLINE
T: 59 deg
T: 43 deg

## 2016-08-27 ENCOUNTER — Ambulatory Visit
Admission: AD | Admit: 2016-08-27 | Discharge: 2016-08-27 | Disposition: A | Discharge status: Home / Self Care | Payer: Self-pay

## 2016-08-27 DIAGNOSIS — J208 Acute bronchitis due to other specified organisms: Secondary | ICD-10-CM

## 2016-08-27 HISTORY — DX: Anxiety disorder, unspecified: F41.9

## 2016-08-27 MED ORDER — FLUTICASONE PROPIONATE HFA 44 MCG/ACT IN AERO *I*
2.0000 | INHALATION_SPRAY | Freq: Two times a day (BID) | RESPIRATORY_TRACT | 0 refills | Status: AC
Start: 2016-08-27 — End: 2016-09-03

## 2016-08-27 MED ORDER — MOMETASONE FUROATE 50 MCG/ACT NA SUSP *I*
2.0000 | Freq: Every day | NASAL | 0 refills | Status: DC
Start: 2016-08-27 — End: 2017-09-19

## 2016-08-27 MED ORDER — DEXTROMETHORPHAN-GUAIFENESIN 30-600 MG PO TB12 *I*
1.0000 | ORAL_TABLET | Freq: Two times a day (BID) | ORAL | 0 refills | Status: DC
Start: 2016-08-27 — End: 2017-09-19

## 2016-08-27 NOTE — UC Provider Note (Signed)
History     Chief Complaint   Patient presents with    Cough     HPI Comments: Patient is a 32 year old female presents with cough 1 week.  Patient states with past 1 week she has been having a nagging dry cough as well as some discomfort in her anterior chest after coughing fit.  Cough worsens at night, with postnasal drip.  Patient states her chest aches after coughing, but denies any sharp pain or chest pressure.  She denies any fevers whatsoever, no chills, body aches, nausea/vomiting/diarrhea.  No significant sputum production, shortness of breath, or sharp pleuritic discomfort.  She has been using a cool mist vaporizer without relief.  No prior asthma history.      History provided by:  Patient  Language interpreter used: No        Past Medical History:   Diagnosis Date    Anxiety             History reviewed. No pertinent surgical history.    History reviewed. No pertinent family history.      Social History    reports that she has never smoked. She has never used smokeless tobacco. She reports that she drinks alcohol. She reports that she does not use illicit drugs. Her sexual activity history is not on file.    Living Situation     Questions Responses    Patient lives with Family    Homeless No    Caregiver for other family member No    External Services None    Employment Employed    Domestic Violence Risk No          Review of Systems   Review of Systems   Constitutional: Negative for activity change, appetite change, chills, fatigue and fever.   HENT: Positive for postnasal drip. Negative for ear pain, rhinorrhea, sinus pressure, sore throat and voice change.    Eyes: Negative for discharge, redness and itching.   Respiratory: Positive for cough. Negative for shortness of breath and wheezing.    Cardiovascular: Positive for chest pain (after coughing). Negative for leg swelling.   Gastrointestinal: Negative for abdominal pain, diarrhea, nausea and vomiting.   Genitourinary: Negative.     Musculoskeletal: Negative for arthralgias, myalgias and neck stiffness.   Skin: Negative.    Neurological: Negative for dizziness, syncope and light-headedness.   Hematological: Negative for adenopathy.   Psychiatric/Behavioral: Negative.        Physical Exam     ED Triage Vitals   BP Heart Rate Heart Rate (via Pulse Ox) Resp Temp Temp src SpO2 (Retired) O2 Device O2 Flow Rate   08/27/16 1254 08/27/16 1254 -- 08/27/16 1254 08/27/16 1254 08/27/16 1254 08/27/16 1254 -- --   109/62 85  16 37.1 C (98.8 F) TEMPORAL 97 %        Weight           --                               Physical Exam   Constitutional: She is oriented to person, place, and time. She appears well-developed and well-nourished.  Non-toxic appearance. She does not appear ill. No distress.   HENT:   Head: Normocephalic and atraumatic.   Right Ear: Hearing, tympanic membrane, external ear and ear canal normal.   Left Ear: Hearing, tympanic membrane, external ear and ear canal normal.   Nose: Mucosal edema present. No  rhinorrhea. No epistaxis.  No foreign bodies. Right sinus exhibits no maxillary sinus tenderness. Left sinus exhibits no maxillary sinus tenderness.   Mouth/Throat: Uvula is midline, oropharynx is clear and moist and mucous membranes are normal. No oral lesions. No uvula swelling. No oropharyngeal exudate, posterior oropharyngeal edema, posterior oropharyngeal erythema or tonsillar abscesses. No tonsillar exudate.   Post nasal drip noted on posterior pharynx   Eyes: Conjunctivae are normal.   Neck: Normal range of motion. Neck supple.   Cardiovascular: Normal rate, regular rhythm, normal heart sounds and intact distal pulses.    No murmur heard.  Pulmonary/Chest: Effort normal. No respiratory distress. She has no decreased breath sounds. She has no wheezes. She has no rhonchi. She has no rales. She exhibits no tenderness.   Lungs are mildly coarse bilaterally, with some wet congestion that clears with cough, with no consolidated rhonchi  on auscultation in any lung field.   Abdominal: Soft. Bowel sounds are normal.   Musculoskeletal: Normal range of motion.   Lymphadenopathy:     She has no cervical adenopathy.   Neurological: She is alert and oriented to person, place, and time.   Skin: Skin is warm and dry. She is not diaphoretic.   Psychiatric: She has a normal mood and affect.   Nursing note and vitals reviewed.       Medical Decision Making        Initial Evaluation:  ED First Provider Contact     Date/Time Event User Comments    08/27/16 1239 ED First Provider Contact Valarie ConesPOLLA, Evett Kassa Initial Face to Face Provider Contact          Patient was seen on: 08/27/2016        Assessment:  30 y.o.female [facility] with 1 week of URI symptoms, primarily a nagging dry cough and anterior chest discomfort after coughing, worse at night with postnasal drip.  Patient is afebrile stable vital signs, and no acute toxicity or distress.    Differential Diagnosis includes   Post nasal drip  Rhinitis  Seasonal allergies  Acute Viral Sinusitis  Acute Allergic Sinusitis  Acute Bacterial Sinusitis  Acute URI NOS  Acute bacterial bronchitis  Acute viral bronchitis  Community acquired pneumonia  Acute pneumonitis                    Plan:   Orders Placed This Encounter    fluticasone (FLOVENT HFA) 44 MCG/ACT inhaler    dextromethorphan-guaifenesin (MUCINEX DM) 30-600 MG per 12 hr tablet    mometasone (NASONEX) 50 MCG/ACT nasal spray       No results found for this or any previous visit (from the past 24 hour(s)).        Final Diagnosis    ICD-10-CM ICD-9-CM   1. Acute viral bronchitis J20.8 466.0     ACUTE BRONCHITIS (ENGLISH) View    ANTIBIOTIC NONUSE (ENGLISH) View    COOL MIST VAPORIZERS (ENGLISH) View    HOW TO USE AN INHALER (ENGLISH) View    Patient's Language: English   Instructions      Please continue use of your Flovent inhaler, 2 puffs, twice daily 7 days.  Be sure to wash your mouth out with mouthwash/Listerine after each use of your  Flovent respiratory steroid inhaler, to prevent development of oral thrush.    You may also purchase and utilize OTC Vicks vapor rub to apply to your chest as directed for cough relief.  Continue use of home  humidifier overnight in your room.  Please continue to hydrate aggressively, and get plenty of rest.  Blow your nose, then use saline nasal spray to rinse bilaterally, blow your nose again, then use steroidal nasal spray.  You need to rest from strenuous or exertional/exacerbating activities for 5 days at minimum.    If your experience any new or evolving chest pains, worsening shortness of breath, coughing up new colors or blood, difficulty breathing, or any new fever/chills/lethargy, you need to re-present to our department or the emergency department for further evaluation and treatment of potential pneumonia development.            Encourage fluids, encourage rest, good hand hygiene.    Use over the counter medications as discussed.    Please start the new medications as below:    Current Discharge Medication List      New Medications    Details Last Dose Given Next Dose Due Script Given?   fluticasone (FLOVENT HFA) 2 puffs Dose: 2 puffs  Inhale 2 puffs into the lungs 2 times daily   Shake well before each use.  Quantity 1 Inhaler, Refill 0  Start date: 08/27/2016, End date: 09/03/2016            dextromethorphan-guaifenesin [facility] DM) 1 tablet Dose: 1 tablet  Take 1 tablet by mouth 2 times daily    Quantity 14 tablet, Refill 0  Start date: 08/27/2016            mometasone (NASONEX) 2 sprays Dose: 2 sprays  2 sprays by Each Nare route daily    Quantity 17 g, Refill 0  Start date: 08/27/2016                   Please follow up with your physician as below:    Follow-up Information     Follow up with Michel Bickers, MD. Go in 5 days.    Specialty:  Family Medicine    Why:  If symptoms worsen or persist/do not improve, For further   evaluation and treatment    Contact information:    [address]  [place]  [place] [address]  [phone]          Thank you Gweneth Fritter for choosing [facility] care for your health concerns.    If your condition changes and/or worsens, please follow up with your primary care provider or return to [facility] urgent care for further evaluation.    If short of breath, chest pains or any other concerns please report to the emergency room.    In the event of an Emergency dial 911.      Final Diagnosis  Final diagnoses:   [J20.8] Acute viral bronchitis (Primary)           Merrily Brittle, PA    Supervising physician Lissa Morales, MD was immediately available     Merrily Brittle, [place]  08/27/16 1331

## 2016-08-27 NOTE — ED Triage Notes (Signed)
Pt c/o one week hx of prod cough, denies fever , pain to chest c coughing. Denies N/V/D.        Triage Note   Tammy Brunty, RN

## 2016-08-27 NOTE — Discharge Instructions (Signed)
Please continue use of your Flovent inhaler, 2 puffs, twice daily 7 days.  Be sure to wash your mouth out with mouthwash/Listerine after each use of your Flovent respiratory steroid inhaler, to prevent development of oral thrush.    You may also purchase and utilize OTC Vicks vapor rub to apply to your chest as directed for cough relief.  Continue use of home humidifier overnight in your room.  Please continue to hydrate aggressively, and get plenty of rest.  Blow your nose, then use saline nasal spray to rinse bilaterally, blow your nose again, then use steroidal nasal spray.  You need to rest from strenuous or exertional/exacerbating activities for 5 days at minimum.    If your experience any new or evolving chest pains, worsening shortness of breath, coughing up new colors or blood, difficulty breathing, or any new fever/chills/lethargy, you need to re-present to our department or the emergency department for further evaluation and treatment of potential pneumonia development.

## 2016-08-29 ENCOUNTER — Emergency Department
Admission: EM | Admit: 2016-08-29 | Disposition: A | Discharge status: Home / Self Care | Payer: Self-pay | Source: Ambulatory Visit | Attending: Emergency Medicine | Admitting: Emergency Medicine

## 2016-08-29 ENCOUNTER — Other Ambulatory Visit: Payer: Self-pay | Admitting: Cardiology

## 2016-08-29 ENCOUNTER — Encounter: Payer: Self-pay | Admitting: Emergency Medicine

## 2016-08-29 LAB — CBC AND DIFFERENTIAL
Baso # K/uL: 0.1 10*3/uL (ref 0.0–0.1)
Basophil %: 0.9 %
Eos # K/uL: 0 10*3/uL (ref 0.0–0.4)
Eosinophil %: 0.8 %
Hematocrit: 41 % (ref 34–45)
Hemoglobin: 13.9 g/dL (ref 11.2–15.7)
IMM Granulocytes #: 0 10*3/uL (ref 0.0–0.1)
IMM Granulocytes: 0.2 %
Lymph # K/uL: 1.8 10*3/uL (ref 1.2–3.7)
Lymphocyte %: 34 %
MCH: 31 pg/cell (ref 26–32)
MCHC: 34 g/dL (ref 32–36)
MCV: 90 fL (ref 79–95)
Mono # K/uL: 0.5 10*3/uL (ref 0.2–0.9)
Monocyte %: 9.6 %
Neut # K/uL: 2.9 10*3/uL (ref 1.6–6.1)
Nucl RBC # K/uL: 0 10*3/uL (ref 0.0–0.0)
Nucl RBC %: 0 /100 WBC (ref 0.0–0.2)
Platelets: 240 10*3/uL (ref 160–370)
RBC: 4.5 MIL/uL (ref 3.9–5.2)
RDW: 11.9 % (ref 11.7–14.4)
Seg Neut %: 54.5 %
WBC: 5.3 10*3/uL (ref 4.0–10.0)

## 2016-08-29 LAB — HOLD BLUE

## 2016-08-29 LAB — EKG 12-LEAD
P: 49 deg
PR: 130 ms
QRS: 65 deg
QRSD: 84 ms
QT: 356 ms
QTc: 398 ms
Rate: 75 {beats}/min
Severity: NORMAL
T: 66 deg

## 2016-08-29 LAB — PLASMA PROF 7 (ED ONLY)
Anion Gap,PL: 11 (ref 7–16)
CO2,Plasma: 24 mmol/L (ref 20–28)
Chloride,Plasma: 104 mmol/L (ref 96–108)
Creatinine: 0.59 mg/dL (ref 0.51–0.95)
GFR,Black: 142 *
GFR,Caucasian: 123 *
Glucose,Plasma: 92 mg/dL (ref 60–99)
Potassium,Plasma: 3.9 mmol/L (ref 3.4–4.7)
Sodium,Plasma: 139 mmol/L (ref 133–145)
UN,Plasma: 12 mg/dL (ref 6–20)

## 2016-08-29 LAB — PREGNANCY TEST, SERUM: Preg,Serum: NEGATIVE

## 2016-08-29 MED ORDER — ACETAMINOPHEN 500 MG PO TABS *I*
1000.0000 mg | ORAL_TABLET | Freq: Once | ORAL | Status: AC
Start: 2016-08-29 — End: 2016-08-29
  Administered 2016-08-29: 1000 mg via ORAL

## 2016-08-29 MED ORDER — ACETAMINOPHEN 500 MG PO TABS *I*
ORAL_TABLET | ORAL | Status: DC
Start: 2016-08-29 — End: 2016-08-29
  Filled 2016-08-29: qty 2

## 2016-08-29 NOTE — ED Provider Notes (Addendum)
History     Chief Complaint   Patient presents with    Chest Pain     HPI Comments: 31 year old girl with history of anxiety presents with 1 week cold symptoms and cough and now with left sided sharp chest pain.  She reports onset of chest pain overnight.  Her pain is central and burning as well as left-sided and sharp.  She reports having a history of similar pain.  She says typically when she has chest pain if she takes her lorazepam her symptoms resolved.  Today it did not resolve with her lorazepam.  Her cough has not been productive.  She has not felt lightheaded.  Her pain does not radiate.  Her pain is not pleuritic.  She has not had any leg swelling.  She's not had any recent travel.  She has no history of blood clots.    Past Medical History:   Diagnosis Date    Anxiety         History reviewed. No pertinent surgical history.  History reviewed. No pertinent family history.    Social History    reports that she has never smoked. She has never used smokeless tobacco. She reports that she drinks alcohol. She reports that she does not use illicit drugs. Her sexual activity history is not on file.    Living Situation     Questions Responses    Patient lives with Family    Homeless No    Caregiver for other family member No    External Services None    Employment Employed    Domestic Violence Risk No          Problem List   There is no problem list on file for this patient.      Review of Systems   Review of Systems   Constitutional: Negative for fever.   HENT: Negative for rhinorrhea.    Eyes: Negative for photophobia.   Respiratory: Negative for shortness of breath.    Cardiovascular: Positive for chest pain.   Gastrointestinal: Negative for abdominal pain and diarrhea.   Endocrine: Negative for polyuria.   Genitourinary: Negative for dysuria.   Musculoskeletal: Negative for back pain.   Allergic/Immunologic: Negative for immunocompromised state.   Neurological: Negative for weakness.   Hematological:  Does not bruise/bleed easily.   Psychiatric/Behavioral: Negative for confusion.       Physical Exam     ED Triage Vitals   BP Heart Rate Heart Rate (via Pulse Ox) Resp Temp Temp src SpO2 (Retired) O2 Device O2 Flow Rate   08/29/16 0916 08/29/16 0916 -- 08/29/16 0916 08/29/16 0916 08/29/16 0916 08/29/16 0916 -- --   112/64 96  18 36.1 C (97 F) TEMPORAL 100 %        Weight           08/29/16 0916           59.9 kg (132 lb)                    Physical Exam   Constitutional: She is oriented to person, place, and time. She appears well-developed and well-nourished.   HENT:   Head: Normocephalic and atraumatic.   Eyes: EOM are normal. Pupils are equal, round, and reactive to light.   Neck: Normal range of motion. Neck supple. No JVD present. No tracheal deviation present.   Cardiovascular: Normal rate and regular rhythm.    No murmur heard.  Pulmonary/Chest: Effort normal. No respiratory distress.  She has no rales. She exhibits no tenderness.   Abdominal: Soft. She exhibits no distension. There is no tenderness. There is no guarding.   Musculoskeletal: Normal range of motion.   Neurological: She is alert and oriented to person, place, and time.   Skin: Skin is warm and dry.   Psychiatric: She has a normal mood and affect. Her behavior is normal.   Nursing note and vitals reviewed.      Medical Decision Making        Initial Evaluation:  ED First Provider Contact     Date/Time Event User Comments    08/29/16 [phone] ED First Provider Contact Hannah Beat W Initial Face to Face Provider Contact          Patient seen by me today 08/29/2016 at 12:00    Assessment:  30 y.o.female comes to the ED with chest pain.   She is very well-appearing.  She has a very low risk profile for chest pain.  She is low risk by well's and PERC negative.  Symptoms are very atypical for ACS, EKG is unremarkable, given her young age and atypical character of her pain no indication for further evaluation for ACS.  Chest pain likely related to  ongoing cough from viral syndrome     Differential Diagnosis includes chest wall pain, bronchitis, pneumonia, PE, ACS      Plan:  CBC, ED 7, chest x-ray, EKG   Discharge with PCP follow-up     Claudius Sis, MD     Claudius Sis, MD  Resident  08/29/16 1552          Resident Attestation:     Patient seen by me on arrival date of 08/29/2016 at 1207.    History:   I reviewed this patient, reviewed the resident's note and agree.    Exam:   I examined this patient, reviewed the resident's note and agree.    Decision Making:   I discussed with the resident his/her documented decision making  and agree.      Author Jarrett Ables, MD     Jarrett Ables, MD  08/29/16 [phone]

## 2016-08-29 NOTE — First Provider Contact (Signed)
ED First Provider Contact Note    Initial provider evaluation performed by   ED First Provider Contact     Date/Time Event User Comments    08/29/16 [phone] ED First Provider Contact Hannah BeatBASTIAN, Caydon Feasel W Initial Face to Face Provider Contact        31 y/o with hx of anxiety, presents with one week of cough and chest pain/tightness states she was diagnosed with bronchitis 2 days ago.  Last night pt noted worsening pain symptoms associated with diaphoresis and palpitations.    Vital signs reviewed.    Orders placed:  EKG, LABS and XRAYS     Patient requires further evaluation.     Tamala SerBradley W Eirene Rather, NP, 08/29/2016, 9:27 AM    Collaborating physician Lenard GallowayMatt Spencer was immediately available     Tamala SerBastian, Shaheen Star W, NP  08/29/16 [phone]

## 2016-08-29 NOTE — ED Triage Notes (Signed)
Pt with burning midsternal and left sided chest pain starting at 0300.  States pain has slightly abated since then.  Endorses SOB, mild dizziness/lightheadness, non productive cough, nausea.  Denies fevers.  States was seen on Sunday for cough and was dx with bronchitis.  EKG in triage.       Triage Note   Gerhard MunchErinn E Trevin Gartrell, RN

## 2016-08-29 NOTE — ED Notes (Signed)
Lt.side CP describes as a burning sensation. Onset this am and has remained constant. +SOB & dizziness. Seen at urgent care on Sunday & diagnosed with bronchitis and prescribed an inhaler & nasal spray with no relief. Cough x 1 wk, productive at times, describes sputum yellow in color. Also reports had the flu in Jan. 2018.

## 2016-08-29 NOTE — Discharge Instructions (Signed)
Take Acetaminophen or Ibuprofen for pain at doses recommended by the manufacturer.

## 2016-10-05 ENCOUNTER — Telehealth: Payer: Self-pay | Admitting: Orthopedic Surgery

## 2016-10-05 ENCOUNTER — Encounter: Payer: Self-pay | Admitting: Gastroenterology

## 2016-10-05 NOTE — Telephone Encounter (Signed)
LM  Pt to call DJ:TTSV date/time /p/w in mail

## 2016-10-12 ENCOUNTER — Other Ambulatory Visit: Payer: Self-pay | Admitting: Gastroenterology

## 2016-10-12 ENCOUNTER — Inpatient Hospital Stay: Admit: 2016-10-12 | Discharge: 2016-10-12 | Disposition: A | Discharge status: Home / Self Care | Payer: Self-pay

## 2016-10-20 ENCOUNTER — Ambulatory Visit: Payer: PRIVATE HEALTH INSURANCE

## 2016-12-14 ENCOUNTER — Telehealth: Payer: Self-pay

## 2016-12-14 NOTE — Telephone Encounter (Signed)
Tammy Fleming needs a call back to reschedule with neurosurgery from missed appt on 4/6.

## 2017-01-26 ENCOUNTER — Ambulatory Visit: Payer: Medicaid Other

## 2017-01-26 ENCOUNTER — Other Ambulatory Visit: Payer: Self-pay

## 2017-01-26 NOTE — H&P (Incomplete)
[facility]  Department of Neurosurgery  Office Note         01/26/2017    Michel BickersAguirre, Richard, MD   [address]  [place] [id number]     Re: Tammy FritterSamantha Cayabyab  [birth year]    CC: Consult for ***    Dear Dr. Michel BickersAguirre, Richard, MD,    We had the opportunity to evaluate Tammy FritterSamantha Gonsoulin in our Neurosurgical office today. She is a 31 y.o. female with ***    Past Medical History:   Past Medical History:   Diagnosis Date    Anxiety        Past Surgical History:   No past surgical history on file.    Family History:   No family history on file.    Social History:    reports that she has never smoked. She has never used smokeless tobacco. She reports that she drinks alcohol. She reports that she does not use illicit drugs.    Allergies:   Allergies   Allergen Reactions    Tamiflu [Oseltamivir Phosphate] Hives       Medications:    Current Outpatient Prescriptions:     PARoxetine (PAXIL) 10 MG tablet, Take 10 mg by mouth every morning, Disp: , Rfl:     diazepam (VALIUM) 5 MG tablet, Take 5 mg by mouth 2 times daily as needed for Anxiety, Disp: , Rfl:     fluticasone (FLOVENT HFA) 44 MCG/ACT inhaler, Inhale 2 puffs into the lungs 2 times daily   Shake well before each use., Disp: 1 Inhaler, Rfl: 0    dextromethorphan-guaifenesin (MUCINEX DM) 30-600 MG per 12 hr tablet, Take 1 tablet by mouth 2 times daily, Disp: 14 tablet, Rfl: 0    mometasone (NASONEX) 50 MCG/ACT nasal spray, 2 sprays by Each Nare route daily, Disp: 17 g, Rfl: 0    Review of Systems: As above, otherwise 12 point review of systems negative***    Physical Examination:  Constitutional: {general exam:16600}  Head: {head exam:30909::"Normocephalic, without obvious abnormality","atraumatic"}  Lungs: clear, breathing easily  Heart: RRR  Abdomen: soft, nondistended  Extremities: {extremity exam:5109::"extremities normal, atraumatic, no cyanosis or edema"}    Neuro: A complete neurological examination was performed, in  detail:    Mental status:  CN:  Motor:   Tone:   Strength:  Sensory:  Reflexes:   Biceps:   Triceps:   Patellar:   Ankle:  Cerebellar/Coordination:   RAMs   Finger to nose   Heel to shin   Gait      Assessment/Plan:  Tammy Fleming is a 31 y.o. female with ***.  Their imaging was personally reviewed, which showed ***.      The patient was seen with Dr. Marland Kitchen***.    Thank you for allowing us to participate in her care.  Please do not hesitate to contact us with questions or concerns.    Sincerely,      Author: Jamelle Haringhristine Yovanni Frenette  as of: 01/26/2017  at: 12:19 PM\

## 2017-02-11 ENCOUNTER — Ambulatory Visit: Admit: 2017-02-11 | Discharge: 2017-02-11 | Disposition: A | Discharge status: Home / Self Care | Payer: Medicaid Other

## 2017-02-11 ENCOUNTER — Ambulatory Visit
Admission: AD | Admit: 2017-02-11 | Discharge: 2017-02-11 | Disposition: A | Discharge status: Home / Self Care | Payer: Medicaid Other | Source: Ambulatory Visit | Attending: Urgent Care | Admitting: Urgent Care

## 2017-02-11 ENCOUNTER — Ambulatory Visit: Payer: Medicaid Other

## 2017-02-11 DIAGNOSIS — M25571 Pain in right ankle and joints of right foot: Secondary | ICD-10-CM

## 2017-02-11 DIAGNOSIS — M79671 Pain in right foot: Secondary | ICD-10-CM

## 2017-02-11 DIAGNOSIS — W19XXXA Unspecified fall, initial encounter: Secondary | ICD-10-CM

## 2017-02-11 DIAGNOSIS — S93401A Sprain of unspecified ligament of right ankle, initial encounter: Secondary | ICD-10-CM

## 2017-02-11 NOTE — ED Triage Notes (Signed)
Patient states she was jumping on a trampoline last night and twisted her right ankle. She has become non weight bearing but has not taken anything for the pain.    Triage Note   Tammy Odorempestt Moataz Tavis, RN

## 2017-02-11 NOTE — UC Provider Note (Signed)
History     Chief Complaint   Patient presents with    Ankle Injury     Patient states she was jumping on a trampoline last night and twisted her right ankle. She has become non weight bearing but has not taken anything for the pain.     HPI Comments: 31 year old female presents with right ankle injury.  Patient reports she was jumping on trampoline last night and rolled the ankle.  Reports it was an inversion injury.  States pain and swelling primarily in the lateral ankle.  States pain is diffuse and radiating into her foot.  Reports she was able to limp on it yesterday.  Reports patient is unable to walk on it today due to pain.  Patient has not tried anything for the pain.  She denies previous fractures or surgeries.      History provided by:  Patient  Language interpreter used: No        Medical/Surgical/Family History     Past Medical History:   Diagnosis Date    Anxiety         There is no problem list on file for this patient.           History reviewed. No pertinent surgical history.  History reviewed. No pertinent family history.       Social History   Substance Use Topics    Smoking status: Never Smoker    Smokeless tobacco: Never Used    Alcohol use Yes      Comment: rarely     Living Situation     Questions Responses    Patient lives with Family    Homeless No    Caregiver for other family member No    External Services None    Employment Employed    Domestic Violence Risk No                Review of Systems   Review of Systems   Constitutional: Negative for fever.   HENT: Negative for rhinorrhea.    Eyes: Negative for redness.   Respiratory: Negative for shortness of breath.    Cardiovascular: Negative for chest pain.   Musculoskeletal: Positive for arthralgias, gait problem, joint swelling and myalgias.   Skin: Negative for wound.   Allergic/Immunologic: Negative for immunocompromised state.   Neurological: Negative for seizures.   Psychiatric/Behavioral: Negative for behavioral problems.            Physical Exam   Triage Vitals  Triage Start: Start, (02/11/17 1041)   First Recorded BP: 117/62, Resp: 18, Temp: 36.3 C (97.3 F) Oxygen Therapy SpO2: 99 %, Heart Rate: 104, (02/11/17 1042)  .      Physical Exam   Constitutional: She appears well-developed. No distress.   HENT:   Head: Atraumatic.   Nose: Nose normal.   Eyes: Conjunctivae are normal.   Cardiovascular: Normal rate and regular rhythm.    Pulses:       Dorsalis pedis pulses are 2+ on the right side   Pulmonary/Chest: Effort normal. No stridor. No respiratory distress.   Musculoskeletal:        Right knee: She exhibits normal range of motion. No tenderness found.        Right ankle: She exhibits decreased range of motion, swelling and ecchymosis. She exhibits no deformity, no laceration and normal pulse. Tenderness. Lateral malleolus, medial malleolus and head of 5th metatarsal tenderness found. No proximal fibula tenderness found. Achilles tendon exhibits no pain and no  defect.        Right foot: There is tenderness, bony tenderness and swelling. There is normal range of motion, normal capillary refill, no crepitus and no deformity.        Feet:    Neurological: She is alert.   Skin: Skin is warm and dry. She is not diaphoretic.   Psychiatric: She has a normal mood and affect. Her behavior is normal. Judgment and thought content normal.   Nursing note and vitals reviewed.       Medical Decision Making      Amount and/or Complexity of Data Reviewed  Tests in the radiology section of CPT: ordered and reviewed  Independent visualization of images, tracings, or specimens: yes        Initial Evaluation:  ED First Provider Contact     Date/Time Event User Comments    02/11/17 1048 ED First Provider Contact Scot Jun Initial Face to Face Provider Contact          Patient was seen on: 02/11/2017        Assessment:  30 y.o.female [facility] with Ankle and foot pain after injuring ankle on a trampoline last night    Differential  Diagnosis includes:  Ankle Sprain  Contusion  Fracture  Ligament Injury  Muscle Strain      Plan: Discussed supportive care. Follow up with PCP as needed  Orders Placed This Encounter   Procedures    ED/UC Aircast Ankle Stirrups - Standard     Standing Status:   Standing     Number of Occurrences:   1    Crutches     Standing Status:   Standing     Number of Occurrences:   1    * Ankle RIGHT standard AP, lateral, mortise views     Standing Status:   Standing     Number of Occurrences:   1     Order Specific Question:   Signs and symptoms/indications     Answer:   rigth lateral ankle pain s/p fall     Order Specific Question:   Patient pregnant     Answer:   No     Order Specific Question:   Where should test be performed?     Answer:   [facility]    * Foot RIGHT standard AP, Lateral, Oblique views     Standing Status:   Standing     Number of Occurrences:   1     Order Specific Question:   Signs and symptoms/indications     Answer:   right foot pain, primarily proximal foot     Order Specific Question:   Patient pregnant     Answer:   No     Order Specific Question:   Where should test be performed?     Answer:   [facility]    Please Provide Supply: Ankle Aircast     Standing Status:   Standing     Number of Occurrences:   1     Order Specific Question:   Please Provide Supply:     Answer:   Ankle Aircast    Apply ace wrap     Standing Status:   Standing     Number of Occurrences:   1    Please Provide Supply: Crutches     Standing Status:   Standing     Number of Occurrences:   1     Order Specific  Question:   Please Provide Supply:     Answer:   Crutches       Xray ordered and reviewed, no obvious acute fracture or dislocation, official radiology read pending at time of discharge, any deviation from plan at time of discharge will be conveyed telephonically      Final Diagnosis  Final diagnoses:   [S93.401A] Sprain of right ankle, unspecified ligament, initial encounter (Primary)         Providence CrosbyKayleen  N Garrie Elenes, PA    Supervising physician Harless LittenMichael Kamali, MD was immediately available          Providence CrosbySagola, Fahim Kats N, [place]  02/11/17 1149

## 2017-02-11 NOTE — Discharge Instructions (Signed)
You may use an ACE wrap for support and compression    Please utilize crutches for nonweightbearing status on affected ankle ×48 hours, followed by 2-3 days of toe-touch only on affected ankle, afterwards you may discontinue crutches use.    Continue to apply ice to affected area, 10 minutes on-10 minutes off, for pain/inflammation/swelling relief.    For pain/inflammation relief, please continue to take 400-600 mg of ibuprofen every 6-8 hours, accompanied with ice application to affected area.    Please elevate your affected lower leg when seated or when lying down at night.  You need to rest your ankle from strenuous or exertional/exacerbating activities for one week at minimum.    If your symptoms do not improve after 1 week's time after conservative management and consistent "RICE" of the ankle, please make an appointment with orthopedics to be further evaluated and treated (contact information provided in discharge paperwork)

## 2017-02-14 ENCOUNTER — Ambulatory Visit: Payer: Medicaid Other | Attending: Orthopedic Surgery | Admitting: Orthopedic Surgery

## 2017-02-14 ENCOUNTER — Ambulatory Visit: Payer: BLUE CROSS/BLUE SHIELD | Attending: Orthopedic Surgery

## 2017-02-14 ENCOUNTER — Encounter: Payer: Self-pay | Admitting: Orthopedic Surgery

## 2017-02-14 ENCOUNTER — Telehealth: Payer: Self-pay

## 2017-02-14 VITALS — BP 102/66 | HR 71 | Ht 60.0 in | Wt 132.0 lb

## 2017-02-14 DIAGNOSIS — S93491A Sprain of other ligament of right ankle, initial encounter: Secondary | ICD-10-CM

## 2017-02-14 DIAGNOSIS — S93491D Sprain of other ligament of right ankle, subsequent encounter: Secondary | ICD-10-CM

## 2017-02-14 NOTE — Progress Notes (Signed)
Walk-in Visit  [facility]  [phone]    Patient name: Gweneth FritterSamantha Wolpert   MRN: [id number]      ICD-10-CM ICD-9-CM   1. Sprain of anterior talofibular ligament of right ankle, subsequent encounter S93.491D V58.89     845.09       Pain    02/14/17 1652   PainSc:   8       The Patient was provided with the following items:  Device  Side: Right  Size:: Small  Model:: Maxtrax High Tide Walking Boot  Manufacturer: [employer]  Part Number: [id number]  Warranty:: 90 Days  Quantity: 1  Status: Delivered    Functional Goals: Immobilization/Reduce joint motion    ROM settings: N/A    Expected Frequency / Duration of Use:   Daily    Modifications made: No modifications were required at this time    Complexity:   Fitting was routine, requiring little to no adjustments    Yes, Device was inspected for safety/security and found to be functioning properly    Ordered/Delivered: Device Delivered    The patient given verbal instructions.  The following instructions were reviewed: Purpose of device, Cleaning / Care of device, Potential Risks / Benefits, Frequency / Duration of use and How to report potential failure / malfunctions    Kamyiah Colantonio K Dennisse Swader

## 2017-02-14 NOTE — Telephone Encounter (Signed)
[facility]     Visit location: [facility]  Date of service: 02/11/17    Condition of patient: patient believes that condition has gotten worse    Patient seeking follow-up with PCP or specialist: gave her the phone number for ortho urgent care    Prescriptions filled: n/a    What did you like best about your visit?: quick    What would you have liked us to do better?: nothing    Would you consider returning as a patient to this site?: yes      Other questions/concerns voiced by patient: none    Tammy Fleming on 02/14/2017 at 10:29 AM

## 2017-02-14 NOTE — Patient Instructions (Signed)
The Patient was provided with the following items:  Device  Side: Right  Size:: Small  Model:: Maxtrax High Tide Walking Boot  Manufacturer: [employer]  Part Number: [id number]  Warranty:: 90 Days  Quantity: 1  Status: Delivered    [facility]      Your physician has determined that you require Prefabricated (off-the-shelf) Orthotic and Prosthetic (O&P) devices and/or Durable Medical Equipment (DME).  O&P devices include items such as braces for the spine or limbs, while DME includes items such as canes, crutches and walkers.  For your convenience you were fitted by the [facility].    Return Policy:     Prefabricated O&P and DME items are not returnable if used outside of clinic due to hygiene concerns.     Special or custom orders are not returnable or refundable.     O&P devices and DME purchased from the [facility] may only be exchanged if the item is faulty or poorly fitting, as determined by the O&P Department Clinical Coordinator.  Exchanges will be made using the same type of device, if within 7 days of the purchase date.     No exchange will be issued for items that were not used in accordance with the manufacturers recommended standard of care.     Replacement or repair of minor parts could become necessary due to wear.  This may include a charge and an order from you physician may be required.      Patient Instructions for High Tide Walking Boot  Purpose of device   You have been provided with a HTW boot that has been prescribed by your physician.  The HTW helps to immobilize your ankle and facilitate healing following injury or surgery.    Wear Instructions  Your HTW should always be worn with a sock to maintain cleanliness.    Donning Your High Tide Walking Boot   With clean sock donned, slide heel into back of liner   Velcro liner closed, if needed extension straps can be added.   Velcro straps so that your boot fits  snug.      Cleaning and Care   The device can be cleaned by hand using mild soap and water.   The best way to keep the device clean is to change the under-sock frequently   Velcro straps can be cleaned with a stiff bristle brush.     Frequency / Duration of use   Your physician will determine the overall length of time you will need to use your boot.    Potential Risks / Benefits   Always ensure heel is properly seated at the back of boot.   Your boot has been adjusted to you.  Do not remove liner from uprights    How to report potential failure/malfunction   If you have any concerns or questions regarding the fit of your boot please contact [facility] at [phone]

## 2017-02-26 ENCOUNTER — Encounter: Payer: Self-pay | Admitting: Orthopedic Surgery

## 2017-02-26 NOTE — Progress Notes (Signed)
CHIEF COMPLAINT: right ankle pain  Tammy Fleming presents to the office today for urgent evaluation of her right ankle pain.   Location: lateral aspect of ankle  Duration: 2 days  Intensity: moderate sharp  Aggravating: WB  Relieving factors: rest, ice, elevation  Injury: inversion injury  Treatment to date: rest, ice, elevation    Patient's past medical history, medications, allergies, surgical history, family history, problem list and social history have all been reviewed and confirmed in the electronic record.    The following systems were reviewed:  Gastrointestinal: Frequent indigestion, blood in stools, heartburn, colitis, ulcer, nausea  Urinary: Kidney stones, difficult urination, frequent urination, burning, painful  Neurologic: Paralysis, tingling in arms or legs, weakness, seizures, numbness, tremor  Integumentary: Frequent rashes, boils, infection, itching, delayed wound healing  Vascular: Vein problems, phlebitis, blood clots, calf pain, bleeding difficulty, easy bruising  Cardiac: Chest pain, irregular heartbeat, heart murmur, shortness of breath  Pulmonary: Shortness of breath, wheezing, chronic cough, difficulty breathing lying flat  Constitutional: Fevers, chills, night sweats, unintentional weight loss or gain, appetite loss  Musculoskeletal: Multiple joint aches or swelling, fibromyalgia, joint instability  Hematologic: Easy bruising, swelling or masses  Psychiatric: Depression, anxiety, bipolar disorder, schizophrenia  Positives noted include: none    EXAM:   Patient is awake alert and fully oriented.  Patient is pleasant and cooperative.  Patient is well-developed and well-nourished.  Patient is in no apparent distress.  Vital signs reviewed in electronic record. Patient with normal respiratory effort.   Lower extremity: exam of right lower extremity reveals  Skin: pink, warm, intact  Swelling: mild to moderate swelling to ankle  ROM: limited ankle motion  Stability: unable to assess  Sensation:  grossly intact  Motor: good motor function  Pulses: PT and DP palpation  Tenderness: to palpation to ATFL  Exam of the contralateral foot reveals pink and warm foot, sensation grossly intact, palpable pulses, good ankle ROM, no instability, Achilles intact and non tender, no tenderness to palpation, calf is soft and non tender    Imaging: Imaging studies performed today were personally reviewed and reveal no acute bony abnormalities to suggest fracture, stress fracture or dislocation    Impression: Right ankle sprain    Plan: Tammy Fleming was referred to O&P for a high tide walking boot.  Advised she may WB as tolerated in the walking boot.  Continue rest, ice, and elevation as needed.  Continue NSAID's or tylenol as needed.  Advised pt to take 325 mg aspirin daily for DVT prophylaxis.  Follow up in 2 weeks for re-evaluation.     Follow up: 2 weeks    X-rays on return: WB right ankle, if pain persists    Attending: Dr. Myer Peerh    Tammy Stierwalt M Oswell Say, PA

## 2017-03-01 ENCOUNTER — Ambulatory Visit: Payer: Medicaid Other | Admitting: Orthopedic Surgery

## 2017-03-05 ENCOUNTER — Encounter: Payer: Self-pay | Admitting: Orthopedic Surgery

## 2017-07-26 ENCOUNTER — Other Ambulatory Visit: Payer: Self-pay | Admitting: Emergency Medicine

## 2017-07-26 DIAGNOSIS — R079 Chest pain, unspecified: Secondary | ICD-10-CM

## 2017-07-26 LAB — CBC AND DIFFERENTIAL
Baso # K/uL: 0 10*3/uL (ref 0.0–0.1)
Basophil %: 0.8 % (ref 0.1–1.2)
Eos # K/uL: 0 10*3/uL (ref 0.0–0.4)
Eosinophil %: 0.8 % (ref 0.7–5.8)
Hematocrit: 43.6 % (ref 34.1–44.9)
Hemoglobin: 15.3 g/dL (ref 11.2–15.7)
Immature Granulocytes Absolute: 0.02 10*3/uL (ref 0.0–0.2)
Immature Granulocytes: 0.4 % (ref 0.0–2.0)
Lymph # K/uL: 1.4 10*3/uL (ref 1.2–3.7)
Lymphocyte %: 28.7 % (ref 19.3–51.7)
MCH: 32.6 pg — ABNORMAL HIGH (ref 25.6–32.2)
MCHC: 35.1 g/dL (ref 32.2–35.5)
MCV: 92.8 fL (ref 79.4–94.8)
Mono # K/uL: 0.5 10*3/uL (ref 0.2–0.9)
Monocyte %: 9.6 % (ref 4.7–12.5)
Neut # K/uL: 2.9 10*3/uL (ref 1.6–6.0)
Nucl RBC # K/uL: 0 /100 WBC (ref 0.0–0.2)
Platelets: 230 10*3/uL (ref 182–369)
RBC Distribution Width-SD: 38.8 fL (ref 36.4–46.3)
RBC: 4.7 10*6/uL (ref 3.93–5.22)
RDW: 11.4 % — ABNORMAL LOW (ref 11.7–14.4)
Seg Neut %: 59.7 % (ref 34.0–71.1)
WBC: 4.9 10*3/uL (ref 4.0–10.0)

## 2017-07-26 LAB — LIPASE: Lipase: 25 U/L (ref 13–60)

## 2017-07-26 LAB — COMPREHENSIVE METABOLIC PANEL
ALT: 12 U/L (ref 0–35)
AST: 18 U/L (ref 0–35)
Albumin: 4.3 g/dL (ref 3.5–5.2)
Alk Phos: 67 U/L (ref 35–105)
Anion Gap: 11 (ref 7–16)
Bilirubin,Total: 0.8 mg/dL (ref 0.0–1.2)
CO2: 25 mmol/L (ref 20–28)
Calcium: 9.5 mg/dL (ref 8.8–10.2)
Chloride: 100 mmol/L (ref 96–108)
Creatinine: 0.63 mg/dL (ref 0.51–0.95)
GFR,Black: 133 mL/min/{1.73_m2}
GFR,Caucasian: 110 mL/min/{1.73_m2}
Glucose: 90 mg/dL (ref 60–99)
Lab: 13 mg/dL (ref 6–20)
Potassium: 4 mmol/L (ref 3.4–4.7)
Sodium: 136 mmol/L (ref 133–145)
Total Protein: 7.4 g/dL (ref 6.3–7.7)

## 2017-09-19 ENCOUNTER — Emergency Department
Admission: EM | Admit: 2017-09-19 | Discharge: 2017-09-19 | Disposition: A | Discharge status: Home / Self Care | Payer: BLUE CROSS/BLUE SHIELD | Source: Ambulatory Visit | Attending: Emergency Medicine | Admitting: Emergency Medicine

## 2017-09-19 ENCOUNTER — Other Ambulatory Visit: Payer: Self-pay | Admitting: Cardiology

## 2017-09-19 ENCOUNTER — Encounter: Payer: Self-pay | Admitting: Geriatric Medicine

## 2017-09-19 ENCOUNTER — Ambulatory Visit
Admission: AD | Admit: 2017-09-19 | Discharge: 2017-09-19 | Disposition: A | Discharge status: Other Acute Inpatient Hospital | Payer: BLUE CROSS/BLUE SHIELD | Source: Ambulatory Visit | Attending: Urgent Care | Admitting: Urgent Care

## 2017-09-19 ENCOUNTER — Emergency Department: Payer: BLUE CROSS/BLUE SHIELD

## 2017-09-19 DIAGNOSIS — R0781 Pleurodynia: Secondary | ICD-10-CM | POA: Insufficient documentation

## 2017-09-19 DIAGNOSIS — M549 Dorsalgia, unspecified: Secondary | ICD-10-CM

## 2017-09-19 DIAGNOSIS — R0602 Shortness of breath: Secondary | ICD-10-CM

## 2017-09-19 DIAGNOSIS — F17210 Nicotine dependence, cigarettes, uncomplicated: Secondary | ICD-10-CM | POA: Insufficient documentation

## 2017-09-19 DIAGNOSIS — R079 Chest pain, unspecified: Secondary | ICD-10-CM

## 2017-09-19 DIAGNOSIS — I499 Cardiac arrhythmia, unspecified: Secondary | ICD-10-CM

## 2017-09-19 DIAGNOSIS — Z716 Tobacco abuse counseling: Secondary | ICD-10-CM | POA: Insufficient documentation

## 2017-09-19 DIAGNOSIS — R42 Dizziness and giddiness: Secondary | ICD-10-CM

## 2017-09-19 DIAGNOSIS — Z01810 Encounter for preprocedural cardiovascular examination: Secondary | ICD-10-CM

## 2017-09-19 LAB — BASIC METABOLIC PANEL
Anion Gap: 10 (ref 7–16)
CO2: 24 mmol/L (ref 20–28)
Calcium: 9.2 mg/dL (ref 8.8–10.2)
Chloride: 105 mmol/L (ref 96–108)
Creatinine: 0.65 mg/dL (ref 0.51–0.95)
GFR,Black: 136 *
GFR,Caucasian: 118 *
Glucose: 101 mg/dL — ABNORMAL HIGH (ref 60–99)
Lab: 13 mg/dL (ref 6–20)
Potassium: 4.1 mmol/L (ref 3.3–5.1)
Sodium: 139 mmol/L (ref 133–145)

## 2017-09-19 LAB — CBC AND DIFFERENTIAL
Baso # K/uL: 0.1 10*3/uL (ref 0.0–0.1)
Basophil %: 0.8 %
Eos # K/uL: 0 10*3/uL (ref 0.0–0.4)
Eosinophil %: 0.5 %
Hematocrit: 44 % (ref 34–45)
Hemoglobin: 15.5 g/dL (ref 11.2–15.7)
IMM Granulocytes #: 0 10*3/uL
IMM Granulocytes: 0.3 %
Lymph # K/uL: 1.8 10*3/uL (ref 1.2–3.7)
Lymphocyte %: 26.6 %
MCH: 33 pg — ABNORMAL HIGH (ref 26–32)
MCHC: 36 g/dL (ref 32–36)
MCV: 93 fL (ref 79–95)
Mono # K/uL: 0.6 10*3/uL (ref 0.2–0.9)
Monocyte %: 8.6 %
Neut # K/uL: 4.2 10*3/uL (ref 1.6–6.1)
Nucl RBC # K/uL: 0 10*3/uL (ref 0.0–0.0)
Nucl RBC %: 0 /100 WBC (ref 0.0–0.2)
Platelets: 229 10*3/uL (ref 160–370)
RBC: 4.7 MIL/uL (ref 3.9–5.2)
RDW: 11.1 % — ABNORMAL LOW (ref 11.7–14.4)
Seg Neut %: 63.2 %
WBC: 6.7 10*3/uL (ref 4.0–10.0)

## 2017-09-19 LAB — POCT URINE PREGNANCY

## 2017-09-19 LAB — PROTIME-INR
INR: 1 (ref 0.9–1.1)
Protime: 11.7 s (ref 10.0–12.9)

## 2017-09-19 MED ORDER — IOHEXOL 350 MG/ML (OMNIPAQUE) IV SOLN *I*
1.0000 mL | Freq: Once | INTRAVENOUS | Status: AC
Start: 2017-09-19 — End: 2017-09-19
  Administered 2017-09-19: 70 mL via INTRAVENOUS

## 2017-09-19 MED ORDER — SODIUM CHLORIDE 0.9 % FLUSH FOR PUMPS *I*
0.0000 mL/h | INTRAVENOUS | Status: DC | PRN
Start: 2017-09-19 — End: 2017-09-19

## 2017-09-19 MED ORDER — KETOROLAC TROMETHAMINE 30 MG/ML IJ SOLN *I*
15.0000 mg | Freq: Once | INTRAMUSCULAR | Status: AC
Start: 2017-09-19 — End: 2017-09-19
  Administered 2017-09-19: 15 mg via INTRAVENOUS
  Filled 2017-09-19: qty 1

## 2017-09-19 MED ORDER — DEXTROSE 5 % FLUSH FOR PUMPS *I*
0.0000 mL/h | INTRAVENOUS | Status: DC | PRN
Start: 2017-09-19 — End: 2017-09-19

## 2017-09-19 NOTE — ED Triage Notes (Signed)
[facility]. Recent car trip to [place], patient is a smoker and on hormonal birth control. C/o pain with deep inspiration, L sided chest pain.        Triage Note   Luisa Dagoegan R Reynard Christoffersen, RN

## 2017-09-19 NOTE — UC Provider Note (Signed)
History     Chief Complaint   Patient presents with    Chest Pain     Patient states that she has had left sided sharp chest pain that radiates to her back and instensifies when she tries to take a breath. This all started this morning.     32 year old female with past medical history significant for anxiety presents with left-sided chest pain.  States symptoms began this morning.  States pain is a constant, sharp pain on the left side of her chest radiating to her back underneath her shoulder blade.  States pain worsens with a deep breath.  Currently pain is a 6/10. States feels mildly short of breath and has been a little lightheaded today.  Denies fever, chills, recent illness, congestion, cough gout pain, vomiting, diarrhea or urinary symptoms.  Reports a 10 hour car ride last month.  Patient is also on OCP.  Denies leg edema, calf pain or history of PE/DVT.  Patient reports no change in symptoms with eating.  Patient has not tried using anything over-the-counter.        History provided by:  Patient  Language interpreter used: No        Medical/Surgical/Family History     Past Medical History:   Diagnosis Date    Anxiety         There is no problem list on file for this patient.           History reviewed. No pertinent surgical history.  History reviewed. No pertinent family history.       Social History   Substance Use Topics    Smoking status: Never Smoker    Smokeless tobacco: Never Used    Alcohol use Yes      Comment: rarely     Living Situation     Questions Responses    Patient lives with Family    Homeless No    Caregiver for other family member No    External Services None    Employment Employed    Domestic Violence Risk No                Review of Systems   Review of Systems   Constitutional: Negative for fever.   HENT: Negative for congestion.    Eyes: Negative for redness.   Respiratory: Positive for shortness of breath.    Cardiovascular: Positive for chest pain. Negative for leg swelling.    Gastrointestinal: Negative for abdominal pain, diarrhea, nausea and vomiting.   Genitourinary: Negative for dysuria.   Musculoskeletal: Positive for back pain. Negative for gait problem.   Skin: Negative for wound.   Allergic/Immunologic: Negative for immunocompromised state.   Neurological: Positive for light-headedness. Negative for seizures.   Psychiatric/Behavioral: Negative for behavioral problems.       Physical Exam   Triage Vitals  Triage Start: Start, (09/19/17 1030)   First Recorded BP: 127/74, Resp: 18, Temp: 36.6 C (97.9 F) Oxygen Therapy SpO2: 98 %, Heart Rate: 90, (09/19/17 1046)  .      Physical Exam   Constitutional: She appears well-developed. No distress.   HENT:   Head: Atraumatic.   Nose: Nose normal.   Eyes: Conjunctivae are normal.   Cardiovascular: Normal rate, regular rhythm and normal heart sounds.    Pulmonary/Chest: Effort normal and breath sounds normal. No stridor. No respiratory distress. She has no wheezes. She has no rales.   Abdominal: Bowel sounds are normal. She exhibits no distension. There is no tenderness.  There is no rebound and no guarding.   Musculoskeletal: She exhibits no edema.   Neurological: She is alert.   Skin: Skin is warm and dry. She is not diaphoretic.   Psychiatric: She has a normal mood and affect. Her behavior is normal. Judgment and thought content normal.   Nursing note and vitals reviewed.       Medical Decision Making        Initial Evaluation:  ED First Provider Contact     Date/Time Event User Comments    09/19/17 1039 ED First Provider Contact Scot JunSAGOLA, Dshaun Reppucci Initial Face to Face Provider Contact          Patient was seen on: 09/19/2017        Assessment:  31 y.o.female comes to the Urgent Care Center with Pleuritic chest pain, shortness of breath and lightheadedness that began this morning    Differential Diagnosis includes:  MI  PE  Pneumonia  Pneumothorax  Arrhythmia  Bronchitis  GERD  MSK pain  Anxiety  Pleurisy      Plan: Due to high concern for PE  due to pleuritic nature of pain, shortness of breath with risk factors of recent travel and on OCP recommended further evaluation in the emergency department.  Patient will go by private vehicle.    EKG demonstrated normal sinus rhythm with a heart rate of 90.  No ST changes         Final Diagnosis  Final diagnoses:   [R07.81] Pleuritic chest pain (Primary)         Providence CrosbyKayleen N Lucus Lambertson, PA              Scot JunSagola, Damiyah Ditmars GravityN, [place]  09/19/17 [phone]

## 2017-09-19 NOTE — Discharge Instructions (Signed)
PROCEED DIRECTLY TO THE EMERGENCY DEPARTMENT.     Do not stop for food/ beverages along the way.

## 2017-09-19 NOTE — ED Triage Notes (Signed)
Patient states that she has had left sided sharp chest pain that radiates to her back and instensifies when she tries to take a breath. This all started this morning.    Triage Note   [name] Tammy Miers, RN

## 2017-09-19 NOTE — ED Provider Notes (Signed)
History     Chief Complaint   Patient presents with    Chest Pain     32 year old female presents to the emergency department from urgent care to rule out a PE.  Patient's has pain to the left lateral aspect of her chest wall.  She states that it is worse with inspiration and breathing.  She has had no trauma.  She denies any shortness of breath, heart palpitations or clotting problems.  She is not complaining of any lower leg pain or swelling.  Patient took some ibuprofen this morning without relief.  She has not had any fevers or chills or cough.  She's not had any nausea, vomiting diarrhea abdominal pain or upper respiratory symptoms.  She denies any urinary symptoms or flank pain.        History provided by:  Patient  Language interpreter used: No    Chest Pain   Pain location:  L lateral chest  Pain quality: aching and stabbing    Pain radiates to:  Does not radiate  Pain severity:  Moderate  Onset quality:  Sudden  Timing:  Sporadic  Progression:  Unchanged  Chronicity:  New  Context: breathing and at rest    Context: not lifting, not movement, not raising an arm, not stress and not trauma    Relieved by:  Nothing  Worsened by:  Deep breathing  Ineffective treatments:  Rest (ibuprofen)  Associated symptoms: no abdominal pain, no AICD problem, no altered mental status, no anorexia, no anxiety, no back pain, no claudication, no cough, no diaphoresis, no dizziness, no dysphagia, no fatigue, no fever, no headache, no heartburn, no lower extremity edema, no nausea, no near-syncope, no numbness, no orthopnea, no palpitations, no PND, no shortness of breath, no syncope, no vomiting and no weakness    Risk factors: birth control, immobilization and smoking    Risk factors: no hypertension, not obese, not pregnant and no prior DVT/PE        Medical/Surgical/Family History     Past Medical History:   Diagnosis Date    Anxiety         There is no problem list on file for this patient.           History reviewed. No  pertinent surgical history.  History reviewed. No pertinent family history.       Social History   Substance Use Topics    Smoking status: Current Every Day Smoker     Packs/day: 0.50     Types: Cigarettes    Smokeless tobacco: Never Used    Alcohol use Yes      Comment: rarely     Living Situation     Questions Responses    Patient lives with Family    Homeless No    Caregiver for other family member No    External Services None    Employment Employed    Comment: call Art therapist     Domestic Violence Risk No                Review of Systems   Review of Systems   Constitutional: Negative for diaphoresis, fatigue and fever.   HENT: Negative for congestion and trouble swallowing.    Respiratory: Negative for cough and shortness of breath.    Cardiovascular: Positive for chest pain. Negative for palpitations, orthopnea, claudication, syncope, PND and near-syncope.   Gastrointestinal: Negative for abdominal pain, anorexia, heartburn, nausea and vomiting.   Genitourinary: Negative for difficulty urinating  and flank pain.   Musculoskeletal: Negative for arthralgias and back pain.   Neurological: Negative for dizziness, weakness, numbness and headaches.   Hematological: Negative for adenopathy.       Physical Exam     Triage Vitals  Triage Start: Start, (09/19/17 1138)   First Recorded BP: 119/77, Resp: 18, Temp: 35.8 C (96.4 F), Temp src: TEMPORAL Oxygen Therapy SpO2: 99 %, O2 Device: None (Room air), Heart Rate: 107, (09/19/17 1138)  .  First Pain Reported  0-10 Scale: 7, (09/19/17 1138)       Physical Exam   Constitutional: She is oriented to person, place, and time. She appears well-developed.   HENT:   Head: Normocephalic and atraumatic.   Right Ear: External ear normal.   Left Ear: External ear normal.   Nose: Nose normal.   Mouth/Throat: Oropharynx is clear and moist.   Eyes: Pupils are equal, round, and reactive to light.   Neck: Normal range of motion. Neck supple.   Cardiovascular: Normal rate, regular  rhythm, normal heart sounds and intact distal pulses.    No murmur heard.  Pulmonary/Chest: Effort normal and breath sounds normal. She exhibits tenderness.   Left lateral chest wall tenderness with palpation   Abdominal: Soft. Bowel sounds are normal. She exhibits no distension and no mass. There is no tenderness. There is no guarding.   Musculoskeletal: Normal range of motion.   Neurological: She is alert and oriented to person, place, and time.   Skin: Skin is warm and dry. Capillary refill takes less than 2 seconds.   Psychiatric: She has a normal mood and affect. Her behavior is normal. Judgment and thought content normal.   Nursing note and vitals reviewed.      Medical Decision Making      Amount and/or Complexity of Data Reviewed  Clinical lab tests: ordered and reviewed  Tests in the radiology section of CPT: reviewed and ordered  Independent visualization of images, tracings, or specimens: yes        Initial Evaluation:  ED First Provider Contact     Date/Time Event User Comments    09/19/17 1156 ED First Provider Contact Farmer Mccahill, Linward NatalKARIANN M Initial Face to Face Provider Contact          Patient seen by me on arrival date of 09/19/2017.    Assessment:  32 y.o.female comes to the ED with chest pain with inspiration since this morning, on BCP, smoker and [place]      Differential Diagnosis includes:  PE, pleuritic, MSK    Plan:   Exam  Labs  Labs Reviewed   CBC AND DIFFERENTIAL - Abnormal; Notable for the following:        Result Value    MCH 33 (*)     RDW 11.1 (*)     All other components within normal limits   BASIC METABOLIC PANEL - Abnormal; Notable for the following:     Glucose 101 (*)     All other components within normal limits   PROTIME-INR   POCT URINE PREGNANCY       CT angio  CT angio chest   Final Result      No evidence for central pulmonary embolus.       No acute pulmonary disease.      END OF IMPRESSION      [facility] submits this DICOM format image data and final report  to the [facility], an independent secure electronic  health information exchange, on a reciprocally searchable basis (with patient authorization) for a minimum of 12 months after exam    date.          Toradol-- pain better  Home work note  pcp follow up as needed    Three plus minutes of smoking cessation counseling done.  Patient co-morbidities include None    Risks of smoking reviewed including possible lung disease (COPD, cancer) and cardiac disease.  Options of cessation aides reviewed including nicotine patch, hypnosis, and possible prescription medications.  Patient encouraged to follow-up with outpatient physician.         Linward Natal Mackey Varricchio, PA          Lindell Noe, [place]  09/19/17 1616

## 2017-09-19 NOTE — Discharge Instructions (Signed)
Ibuprofen or aleve as directed  See MD if symptoms persist or get  worse  Avoid lifting moving

## 2017-09-21 LAB — EKG 12-LEAD
P: 57 deg
PR: 129 ms
QRS: 49 deg
QRSD: 82 ms
QT: 350 ms
QTc: 436 ms
Rate: 93 {beats}/min
Severity: NORMAL
T: 52 deg

## 2017-10-21 LAB — EKG 12-LEAD
P: 58 deg
PR: 124 ms
QRS: 53 deg
QRSD: 85 ms
QT: 340 ms
QTc: 416 ms
Rate: 90 {beats}/min
Severity: NORMAL
T: 63 deg

## 2017-12-28 ENCOUNTER — Encounter: Payer: Self-pay | Admitting: Radiology

## 2017-12-28 DIAGNOSIS — R109 Unspecified abdominal pain: Secondary | ICD-10-CM

## 2017-12-28 DIAGNOSIS — R140 Abdominal distension (gaseous): Secondary | ICD-10-CM

## 2018-02-05 ENCOUNTER — Emergency Department
Admission: EM | Admit: 2018-02-05 | Discharge: 2018-02-05 | Discharge status: Against Medical Advice | Payer: Self-pay | Source: Ambulatory Visit

## 2018-02-05 NOTE — ED Triage Notes (Signed)
Pt here with sciatica pain and headache pain for the last 3-4 days, here for eval.        Triage Note   Johnda Billiot L Ruben Mahler, RN

## 2019-03-19 ENCOUNTER — Other Ambulatory Visit
Admission: RE | Admit: 2019-03-19 | Discharge: 2019-03-19 | Disposition: A | Discharge status: Home / Self Care | Payer: PRIVATE HEALTH INSURANCE | Source: Ambulatory Visit | Attending: Obstetrics | Admitting: Obstetrics

## 2019-03-19 DIAGNOSIS — Z01419 Encounter for gynecological examination (general) (routine) without abnormal findings: Secondary | ICD-10-CM | POA: Insufficient documentation

## 2019-03-22 LAB — AEROBIC CULTURE

## 2019-03-26 LAB — TRICHOMONAS DNA AMPLIFICATION: Trichomonas DNA Amplification: 0

## 2019-03-26 LAB — CHLAMYDIA PLASMID DNA AMPLIFICATION: Chlamydia plasmid DNA AMplification: 0

## 2019-03-26 LAB — N. GONORRHOEAE DNA AMPLIFICATION: N. Gonorrhoeae DNA Amplification: 0

## 2019-04-06 LAB — GYN CYTOLOGY

## 2019-07-16 ENCOUNTER — Other Ambulatory Visit: Payer: Self-pay

## 2019-07-16 ENCOUNTER — Emergency Department (HOSPITAL_COMMUNITY)
Admission: EM | Admit: 2019-07-16 | Discharge: 2019-07-16 | Disposition: A | Discharge status: Home / Self Care | Payer: 59 | Attending: Emergency Medicine | Admitting: Emergency Medicine

## 2019-07-16 ENCOUNTER — Encounter (HOSPITAL_COMMUNITY): Payer: Self-pay | Admitting: Emergency Medicine

## 2019-07-16 ENCOUNTER — Emergency Department (HOSPITAL_COMMUNITY): Payer: 59

## 2019-07-16 DIAGNOSIS — R0602 Shortness of breath: Secondary | ICD-10-CM | POA: Insufficient documentation

## 2019-07-16 DIAGNOSIS — R079 Chest pain, unspecified: Secondary | ICD-10-CM | POA: Diagnosis not present

## 2019-07-16 DIAGNOSIS — R0789 Other chest pain: Secondary | ICD-10-CM | POA: Diagnosis present

## 2019-07-16 LAB — BASIC METABOLIC PANEL
Anion gap: 8 (ref 5–15)
BUN: 14 mg/dL (ref 6–20)
CO2: 25 mmol/L (ref 22–32)
Calcium: 9.2 mg/dL (ref 8.9–10.3)
Chloride: 105 mmol/L (ref 98–111)
Creatinine, Ser: 0.72 mg/dL (ref 0.44–1.00)
GFR calc Af Amer: >60 mL/min (ref >60)
GFR calc non Af Amer: >60 mL/min (ref >60)
Glucose, Bld: 106 mg/dL — ABNORMAL HIGH (ref 70–99)
Potassium: 4.3 mmol/L (ref 3.5–5.1)
Sodium: 138 mmol/L (ref 135–145)

## 2019-07-16 LAB — LIPASE, BLOOD: Lipase: 28 U/L (ref 11–51)

## 2019-07-16 LAB — I-STAT BETA HCG BLOOD, ED (MC, WL, AP ONLY): I-stat hCG, quantitative: <5 m[IU]/mL (ref <5)

## 2019-07-16 LAB — HEPATIC FUNCTION PANEL
ALT: 16 U/L (ref 0–44)
AST: 22 U/L (ref 15–41)
Albumin: 4.1 g/dL (ref 3.5–5.0)
Alkaline Phosphatase: 90 U/L (ref 38–126)
Bilirubin, Direct: <0.1 mg/dL (ref 0.0–0.2)
Total Bilirubin: 0.5 mg/dL (ref 0.3–1.2)
Total Protein: 7.6 g/dL (ref 6.5–8.1)

## 2019-07-16 LAB — TROPONIN I (HIGH SENSITIVITY)
Troponin I (High Sensitivity): 5 ng/L (ref <18)
Troponin I (High Sensitivity): 2 ng/L (ref <18)

## 2019-07-16 LAB — CBC
HCT: 45 % (ref 36.0–46.0)
Hemoglobin: 15.3 g/dL — ABNORMAL HIGH (ref 12.0–15.0)
MCH: 31.9 pg (ref 26.0–34.0)
MCHC: 34 g/dL (ref 30.0–36.0)
MCV: 93.9 fL (ref 80.0–100.0)
Platelets: 261 10*3/uL (ref 150–400)
RBC: 4.79 MIL/uL (ref 3.87–5.11)
RDW: 11.3 % — ABNORMAL LOW (ref 11.5–15.5)
WBC: 7.3 10*3/uL (ref 4.0–10.5)
nRBC: 0 % (ref 0.0–0.2)

## 2019-07-16 LAB — D-DIMER, QUANTITATIVE: D-Dimer, Quant: <0.27 ug/mL-FEU (ref 0.00–0.50)

## 2019-07-16 MED ORDER — ALUM & MAG HYDROXIDE-SIMETH 200-200-20 MG/5ML PO SUSP
30.0000 mL | Freq: Once | ORAL | Status: AC
  Administered 2019-07-16: 30 mL via ORAL
  Filled 2019-07-16: qty 30

## 2019-07-16 MED ORDER — OMEPRAZOLE 20 MG PO CPDR: 20.0000 mg | DELAYED_RELEASE_CAPSULE | Freq: Every day | ORAL | 0 refills | Status: AC

## 2019-07-16 MED ORDER — LIDOCAINE VISCOUS HCL 2 % MT SOLN
15.0000 mL | Freq: Once | OROMUCOSAL | Status: AC
  Administered 2019-07-16: 15 mL via ORAL
  Filled 2019-07-16: qty 15

## 2019-07-16 NOTE — Discharge Instructions (Signed)
Your testing is reassuring.  There is no evidence of heart attack or blood clot in the lung.  Avoid alcohol, caffeine, spicy foods, NSAID medications.  You are low risk for cardiac type chest pain but not zero risk.  You should establish care with a primary doctor and arrange an outpatient stress test.  You also need to have a repeat x-ray of your chest in 1 month because of the abnormality of your clavicle.  Return to the ED if your chest pain becomes exertional, associated shortness of breath, vomiting, sweating, any other concerns.

## 2019-07-16 NOTE — ED Triage Notes (Signed)
Pt endorses chest pressure for a few days. States it feels like someone is squeezing it. Pain on left side. Endorses SOB with exertion.

## 2019-07-16 NOTE — ED Provider Notes (Signed)
[facility] EMERGENCY DEPARTMENT Provider Note   CSN: [id number] Arrival date & time: 07/16/19  1408     History Chief Complaint  Patient presents with  . Chest Pain    Cierrah Dace is a 33 y.o. female.  Patient with no medical history here with chest pain for the past several days.  She describes pain and pressure in the center of her chest with intermittent "pinching" underneath her left breast.  She states the pain last for several minutes to several hours at a time.  It is not necessarily exertional or pleuritic.  She feels like her chest is squeezing and tight and she cannot get a full breath in.  She denies feeling short of breath.  No cough or fever.  No leg pain or leg swelling.  Does use Depo-Provera birth control.  There is no association with food.  She feels burning in her chest that occasionally radiates to her left breast underneath it.  There is no abdominal pain, nausea, vomiting.  No cough or fever.  No cardiac history.  The history is provided by the patient.  Chest Pain Associated symptoms: shortness of breath   Associated symptoms: no abdominal pain, no cough, no dizziness, no fever, no headache, no nausea, no vomiting and no weakness        History reviewed. No pertinent past medical history.  There are no problems to display for this patient.   History reviewed. No pertinent surgical history.   OB History   No obstetric history on file.     No family history on file.  Social History   Tobacco Use  . Smoking status: Not on file  Substance Use Topics  . Alcohol use: Not on file  . Drug use: Not on file    Home Medications Prior to Admission medications   Not on File    Allergies    Tamiflu [oseltamivir]  Review of Systems   Review of Systems  Constitutional: Negative for activity change, appetite change and fever.  HENT: Negative for congestion and rhinorrhea.   Respiratory: Positive for chest tightness and  shortness of breath. Negative for cough.   Cardiovascular: Positive for chest pain.  Gastrointestinal: Negative for abdominal pain, nausea and vomiting.  Genitourinary: Negative for dysuria and hematuria.  Musculoskeletal: Negative for arthralgias and myalgias.  Skin: Negative for rash.  Neurological: Negative for dizziness, weakness and headaches.   all other systems are negative except as noted in the HPI and PMH.    Physical Exam Updated Vital Signs BP 104/75 (BP Location: Left Arm)   Pulse 72   Temp 98.2 F (36.8 C) (Oral)   Resp 14   SpO2 100%   Physical Exam Vitals and nursing note reviewed.  Constitutional:      General: She is not in acute distress.    Appearance: She is well-developed. She is obese.  HENT:     Head: Normocephalic and atraumatic.     Mouth/Throat:     Pharynx: No oropharyngeal exudate.  Eyes:     Conjunctiva/sclera: Conjunctivae normal.     Pupils: Pupils are equal, round, and reactive to light.  Neck:     Comments: No meningismus. Cardiovascular:     Rate and Rhythm: Normal rate and regular rhythm.     Heart sounds: Normal heart sounds. No murmur.  Pulmonary:     Effort: Pulmonary effort is normal. No respiratory distress.     Breath sounds: Normal breath sounds.  Chest:  Chest wall: No tenderness.  Abdominal:     Palpations: Abdomen is soft.     Tenderness: There is no abdominal tenderness. There is no guarding or rebound.  Musculoskeletal:        General: No tenderness. Normal range of motion.     Cervical back: Normal range of motion and neck supple.  Skin:    General: Skin is warm.  Neurological:     Mental Status: She is alert and oriented to person, place, and time.     Cranial Nerves: No cranial nerve deficit.     Motor: No abnormal muscle tone.     Coordination: Coordination normal.     Comments:  5/5 strength throughout. CN 2-12 intact.Equal grip strength.   Psychiatric:        Behavior: Behavior normal.     ED Results  / Procedures / Treatments   Labs (all labs ordered are listed, but only abnormal results are displayed) Labs Reviewed  BASIC METABOLIC PANEL - Abnormal; Notable for the following components:      Result Value   Glucose, Bld 106 (*)    All other components within normal limits  CBC - Abnormal; Notable for the following components:   Hemoglobin 15.3 (*)    RDW 11.3 (*)    All other components within normal limits  HEPATIC FUNCTION PANEL  LIPASE, BLOOD  D-DIMER, QUANTITATIVE (NOT AT [facility])  I-STAT BETA HCG BLOOD, ED ([facility], [facility], [facility] ONLY)  TROPONIN I (HIGH SENSITIVITY)  TROPONIN I (HIGH SENSITIVITY)    EKG EKG Interpretation  Date/Time:  Wednesday July 16 2019 14:18:58 EST Ventricular Rate:  85 PR Interval:  114 QRS Duration: 74 QT Interval:  344 QTC Calculation: 409 R Axis:   70 Text Interpretation: Normal sinus rhythm with sinus arrhythmia Normal ECG No previous ECGs available Confirmed by Glynn Octaveancour, Kobi Mario [phone]([id number]) on 07/16/2019 7:31:25 PM   Radiology DG Chest 2 View  Result Date: 07/16/2019 CLINICAL DATA:  Chest pain EXAM: CHEST - 2 VIEW COMPARISON:  None. FINDINGS: There is a 6 mm rounded density projecting over the mid aspect of the right clavicle. There is no pneumothorax. There is no large pleural effusion. There is no focal infiltrate. The heart size is normal. IMPRESSION: 1. No acute cardiopulmonary process. 2. Rounded 6 mm density projecting over the right clavicle as detailed above. A follow-up two-view chest x-ray is recommended in 4-6 weeks to confirm resolution or stability of this finding. Electronically Signed   By: Katherine Mantlehristopher  Green M.D.   On: 07/16/2019 14:48    Procedures Procedures (including critical care time)  Medications Ordered in ED Medications  alum & mag hydroxide-simeth (MAALOX/MYLANTA) 200-200-20 MG/5ML suspension 30 mL (has no administration in time range)    And  lidocaine (XYLOCAINE) 2 % viscous mouth solution 15 mL (has no administration in  time range)    ED Course  I have reviewed the triage vital signs and the nursing notes.  Pertinent labs & imaging results that were available during my care of the patient were reviewed by me and considered in my medical decision making (see chart for details).    MDM Rules/Calculators/A&P                     Intermittent chest pain for the past several days it feels like a pressure in the center of her chest and occasionally radiates to her left breast.  Lasting for several hours at a time.  EKG is sinus rhythm without acute  ST changes. Pain somewhat reproducible.  Patient informed of irregularity of right clavicle seen on x-ray.  This appears normal on physical exam.  Work-up is reassuring.  LFTs and lipase are normal.  D-dimer negative, troponin is negative x2.  Patient anxious to leave.  States her pain is somewhat resolved after GI cocktail.  Her pain description is atypical for ACS.  Will start PPI, avoid alcohol, caffeine, spicy foods, NSAID medications.  Advise follow-up with PCP for stress test.  Low suspicion for ACS Return to the ED if your chest pain becomes exertional, associated shortness of breath, diaphoresis, vomiting, any other concerns.  Also discussed need for follow-up chest x-ray given the abnormality as above.  Final Clinical Impression(s) / ED Diagnoses Final diagnoses:  Atypical chest pain    Rx / DC Orders ED Discharge Orders    None       Jai Steil, Jeannett Senior, MD 07/16/19 2356

## 2019-07-16 NOTE — ED Notes (Signed)
Pt wanting to leave, this RN informed the pt that we are still waiting on one blood test to come back, pt states she will wait 15 more minutes

## 2021-07-19 IMAGING — CR DG CHEST 2V
2 series · 2 of 2 positions shown · non-contrast
Comparison: None.

CLINICAL DATA: Chest pain

EXAM:
CHEST - 2 VIEW

[chest pa]
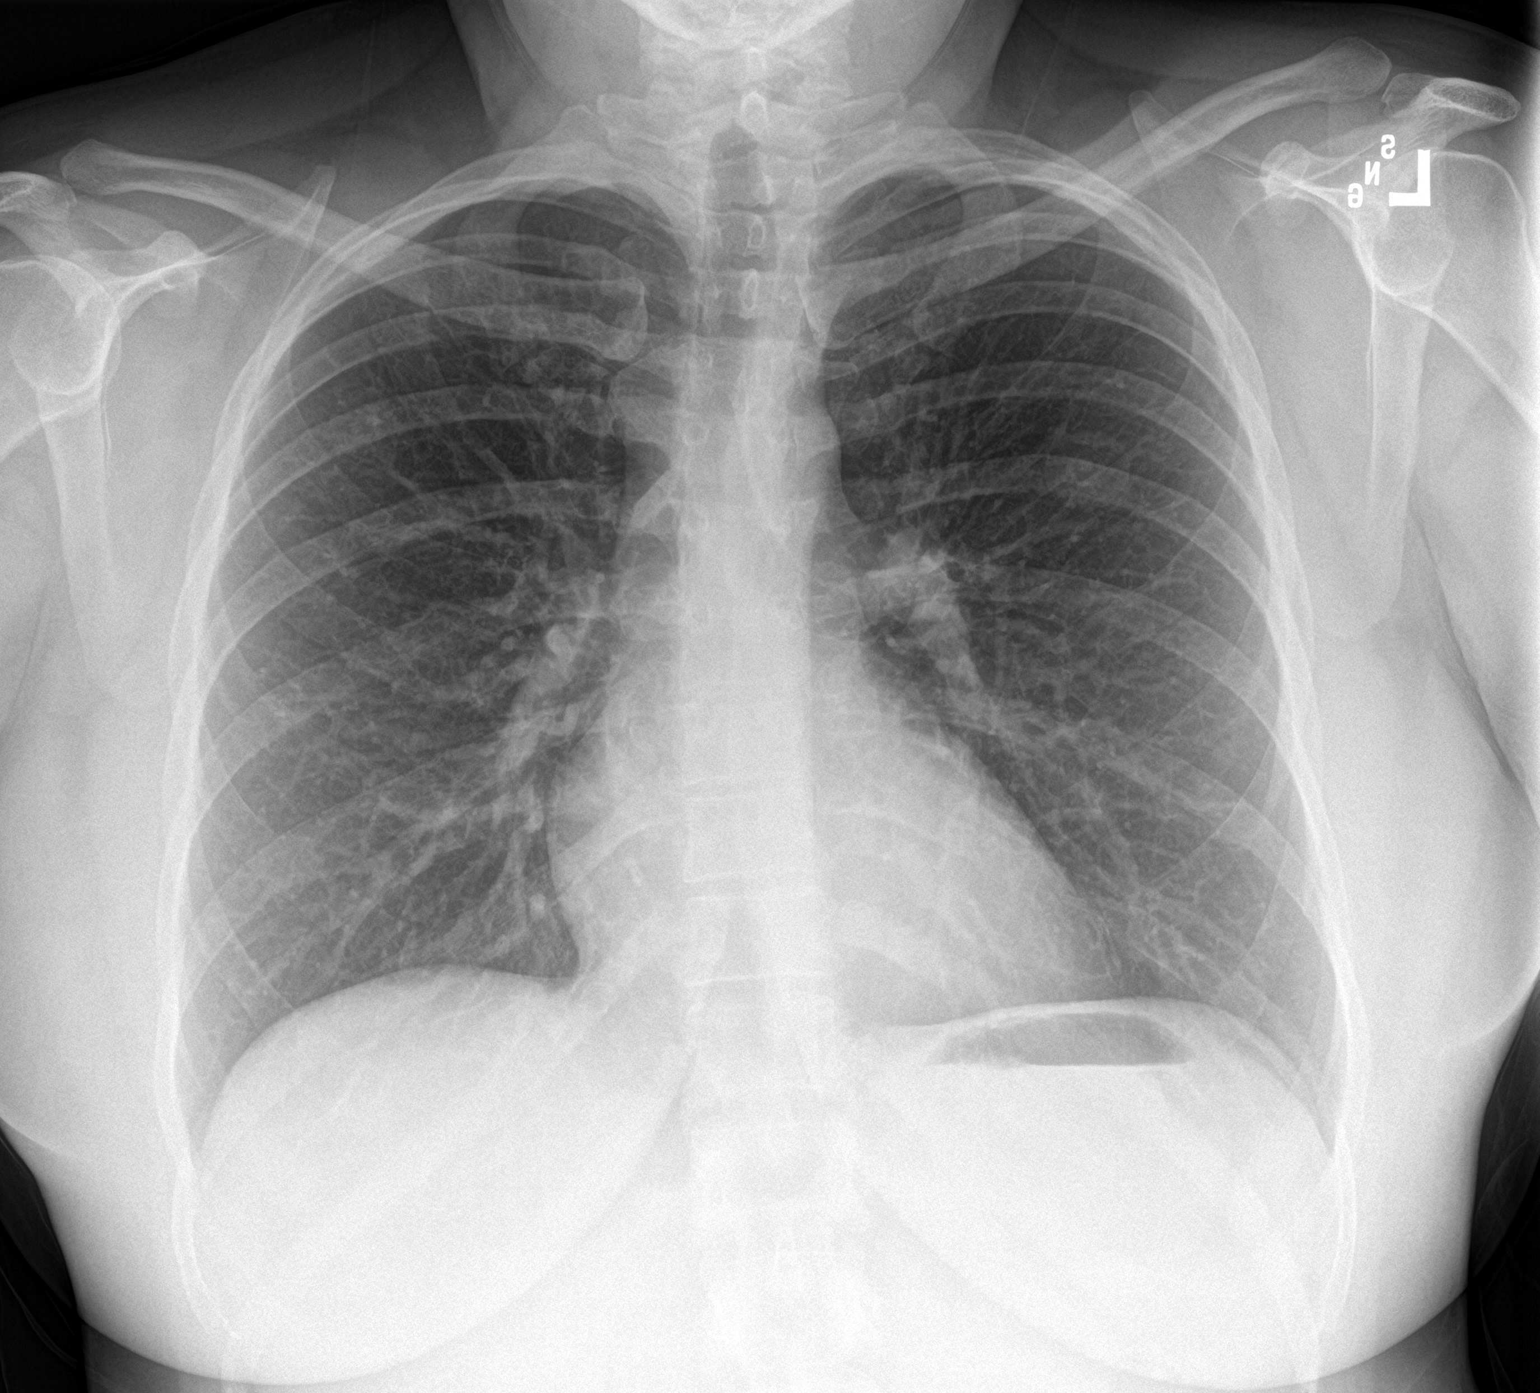

[chest lat]
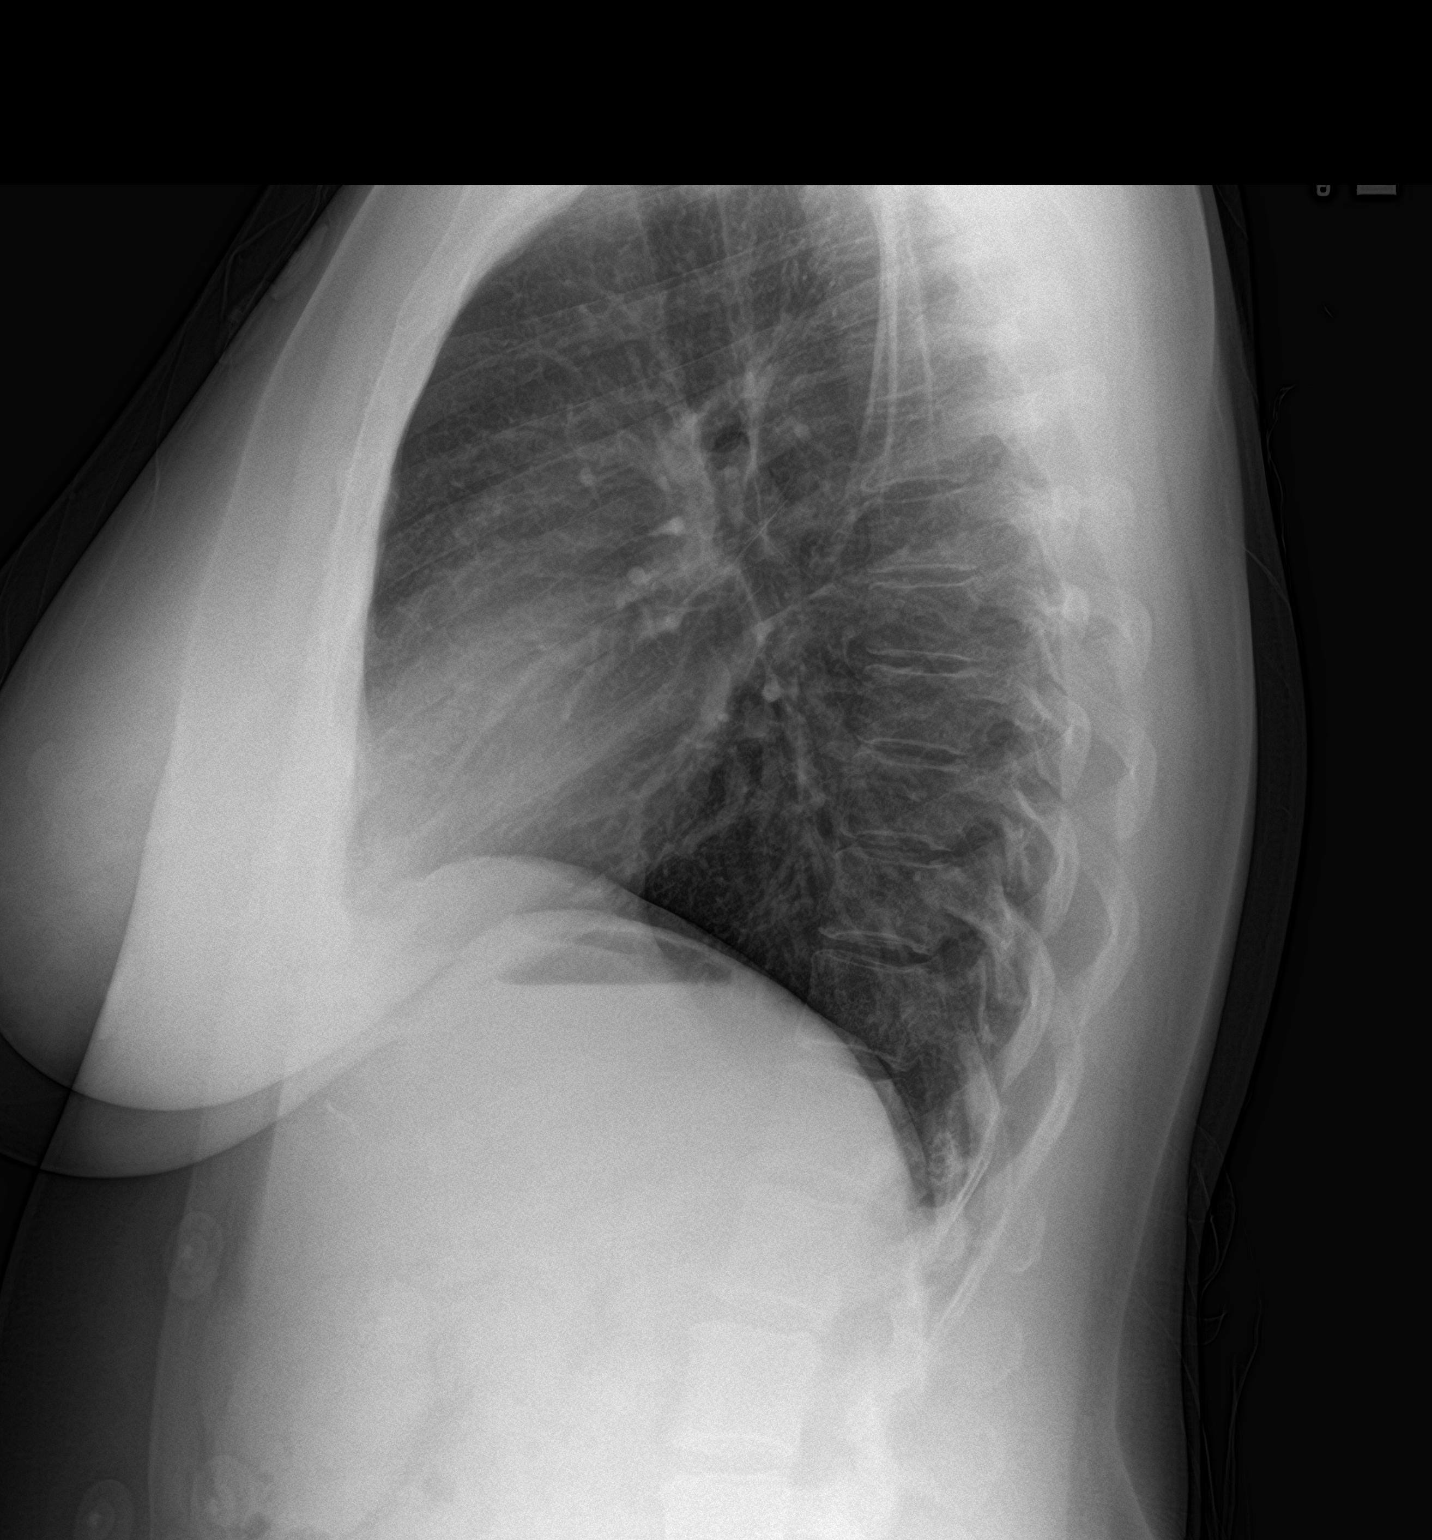

[2 of 2 positions shown; findings below may reference images not displayed]

FINDINGS: There is a 6 mm rounded density projecting over the mid aspect of
the right clavicle. There is no pneumothorax. There is no large
pleural effusion. There is no focal infiltrate. The heart size is
normal.
IMPRESSION: 1. No acute cardiopulmonary process.
2. Rounded 6 mm density projecting over the right clavicle as
detailed above. A follow-up two-view chest x-ray is recommended in
4-6 weeks to confirm resolution or stability of this finding.

## 7770-09-15 DEATH — deceased
